# Patient Record
Sex: Male | Born: 1970 | Race: White | Hispanic: No | Marital: Married | Smoking: Never smoker
Health system: Southern US, Community
[De-identification: ages and names within clinical notes are randomized; demographics above are authoritative.]

## PROBLEM LIST (undated history)

## (undated) DIAGNOSIS — I499 Cardiac arrhythmia, unspecified: Secondary | ICD-10-CM

## (undated) DIAGNOSIS — B029 Zoster without complications: Secondary | ICD-10-CM

## (undated) DIAGNOSIS — T8859XA Other complications of anesthesia, initial encounter: Secondary | ICD-10-CM

## (undated) DIAGNOSIS — I480 Paroxysmal atrial fibrillation: Secondary | ICD-10-CM

## (undated) HISTORY — PX: OTHER SURGICAL HISTORY: SHX169

## (undated) HISTORY — DX: Paroxysmal atrial fibrillation: I48.0

## (undated) HISTORY — DX: Zoster without complications: B02.9

## (undated) HISTORY — PX: FOOT SURGERY: SHX648

## (undated) HISTORY — PX: WISDOM TOOTH EXTRACTION: SHX21

---

## 2001-02-19 ENCOUNTER — Other Ambulatory Visit: Admission: RE | Admit: 2001-02-19 | Discharge: 2001-02-19 | Payer: Self-pay | Admitting: Urology

## 2001-02-19 ENCOUNTER — Encounter (INDEPENDENT_AMBULATORY_CARE_PROVIDER_SITE_OTHER): Payer: Self-pay | Admitting: Specialist

## 2006-01-28 ENCOUNTER — Emergency Department (HOSPITAL_COMMUNITY): Admission: EM | Admit: 2006-01-28 | Discharge: 2006-01-28 | Payer: Self-pay | Admitting: Emergency Medicine

## 2010-07-04 ENCOUNTER — Ambulatory Visit: Payer: Self-pay | Admitting: Family Medicine

## 2010-07-05 LAB — CONVERTED CEMR LAB
ALT: 28 units/L (ref 0–53)
BUN: 13 mg/dL (ref 6–23)
Bilirubin, Direct: 0.1 mg/dL (ref 0.0–0.3)
Calcium: 9.6 mg/dL (ref 8.4–10.5)
Chloride: 106 meq/L (ref 96–112)
Cholesterol: 164 mg/dL (ref 0–200)
Creatinine, Ser: 1.1 mg/dL (ref 0.4–1.5)
HDL: 36.6 mg/dL — ABNORMAL LOW (ref >39.00)
LDL Cholesterol: 111 mg/dL — ABNORMAL HIGH (ref 0–99)
Total Bilirubin: 1.4 mg/dL — ABNORMAL HIGH (ref 0.3–1.2)
Total CHOL/HDL Ratio: 4
Total Protein: 7 g/dL (ref 6.0–8.3)
Triglycerides: 82 mg/dL (ref 0.0–149.0)
VLDL: 16.4 mg/dL (ref 0.0–40.0)

## 2010-08-09 NOTE — Assessment & Plan Note (Signed)
Summary: NEW PT TO EST/CPX/CLE   Vital Signs:  Patient profile:   40 year old male Height:      73 inches Weight:      210.75 pounds BMI:     27.91 Temp:     98.4 degrees F oral Pulse rate:   72 / minute Pulse rhythm:   regular BP sitting:   122 / 78  (left arm) Cuff size:   large  Vitals Entered By: Selena Batten Dance CMA Duncan Dull) (July 04, 2010 9:31 AM) CC: New patient to establish/CPx   History of Present Illness: CC: new patient, CPE  no concerns.  stays active by exercising 3x/wk, weight training.  knees hurt at times so doesn't run or job.  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  tries to eat healthy diet, stays away from greasy foods, fast food, low intake of frutis/vegetables.  preventative: flu - not this year. tetanus - 2007 prostate - strong stream, nocturia colon - no BM changes, no blood in stool. blood work - not in a while.    -  Date:  12/20/2005    TD booster given per patient  Current Medications (verified): 1)  None  Allergies (verified): No Known Drug Allergies  Past History:  Past Medical History: none  Past Surgical History: wisdom teeth  Family History: M: Lung and throat CA (smoker) dec 40yo F: HTN GF: DM  No prostate or colon CA, CAD/MI, CVA  Social History: no smoking, no EtOH, no rec drugs caffeine: cut out sodas, occasional tea Occupation: Physicist, medical for [employer] Lives with wife, 3 children 12th grade  Review of Systems  The patient denies anorexia, fever, weight loss, weight gain, vision loss, decreased hearing, hoarseness, chest pain, syncope, dyspnea on exertion, peripheral edema, prolonged cough, headaches, hemoptysis, abdominal pain, melena, hematochezia, severe indigestion/heartburn, hematuria, incontinence, difficulty walking, depression, and testicular masses.         + hoarse today.  Physical Exam  General:  Well-developed,well-nourished,in no acute distress; alert,appropriate and cooperative  throughout examination Head:  Normocephalic and atraumatic without obvious abnormalities. No apparent alopecia or balding. Eyes:  No corneal or conjunctival inflammation noted. EOMI. Perrla.  Ears:  TMs clear bilaterally Nose:  nares clear bilaterally Mouth:  MMM, no pharyngeal erythema/edema Neck:  No deformities, masses, or tenderness noted.  No LAD, no thyromegaly Lungs:  Normal respiratory effort, chest expands symmetrically. Lungs are clear to auscultation, no crackles or wheezes. Heart:  Normal rate and regular rhythm. S1 and S2 normal without gallop, murmur, click, rub or other extra sounds. Abdomen:  Bowel sounds positive,abdomen soft and non-tender without masses, organomegaly or hernias noted. Msk:  No deformity or scoliosis noted of thoracic or lumbar spine.   Pulses:  2+ rad pulses, brisk cap refill Extremities:  no pedal edema Neurologic:  CN grossly intact ,station and gait intact Skin:  Intact without suspicious lesions or rashes Psych:  full affect, pleasant and cooperative.   Impression & Recommendations:  Problem # 1:  HEALTH MAINTENANCE EXAM (ICD-V70.0)  Reviewed preventive care protocols, scheduled due services, and updated immunizations.  declines flu.  utd tetanus.  discussed healthy eating, healthy lifestyle.  Orders: TLB-Lipid Panel (80061-LIPID) TLB-BMP (Basic Metabolic Panel-BMET) (80048-METABOL) TLB-Hepatic/Liver Function Pnl (80076-HEPATIC)  Patient Instructions: 1)  Blood work today to check cholesterol, sugar. 2)  Return in 1-2 years for next CPE or as needed. 3)  Pleasure to meet you today, call clinic with questions.   Orders Added: 1)  TLB-Lipid Panel [80061-LIPID] 2)  TLB-BMP (  Basic Metabolic Panel-BMET) [80048-METABOL] 3)  TLB-Hepatic/Liver Function Pnl [80076-HEPATIC] 4)  New Patient 40-64 years [99386]    Prior Medications: None Current Allergies (reviewed today): No known allergies

## 2015-11-12 ENCOUNTER — Encounter (HOSPITAL_COMMUNITY): Payer: Self-pay | Admitting: Emergency Medicine

## 2015-11-12 ENCOUNTER — Emergency Department (HOSPITAL_COMMUNITY)
Admission: EM | Admit: 2015-11-12 | Discharge: 2015-11-12 | Disposition: A | Discharge status: Home / Self Care | Payer: BLUE CROSS/BLUE SHIELD | Attending: Emergency Medicine | Admitting: Emergency Medicine

## 2015-11-12 DIAGNOSIS — K5641 Fecal impaction: Secondary | ICD-10-CM | POA: Insufficient documentation

## 2015-11-12 DIAGNOSIS — Z79899 Other long term (current) drug therapy: Secondary | ICD-10-CM | POA: Insufficient documentation

## 2015-11-12 DIAGNOSIS — R109 Unspecified abdominal pain: Secondary | ICD-10-CM | POA: Diagnosis present

## 2015-11-12 DIAGNOSIS — K5900 Constipation, unspecified: Secondary | ICD-10-CM

## 2015-11-12 LAB — CBC
HCT: 44.4 % (ref 39.0–52.0)
HEMOGLOBIN: 14.8 g/dL (ref 13.0–17.0)
MCH: 28.7 pg (ref 26.0–34.0)
MCHC: 33.3 g/dL (ref 30.0–36.0)
MCV: 86 fL (ref 78.0–100.0)
Platelets: 194 10*3/uL (ref 150–400)
RBC: 5.16 MIL/uL (ref 4.22–5.81)
RDW: 12.2 % (ref 11.5–15.5)
WBC: 13.8 10*3/uL — ABNORMAL HIGH (ref 4.0–10.5)

## 2015-11-12 LAB — COMPREHENSIVE METABOLIC PANEL
ALK PHOS: 61 U/L (ref 38–126)
ALT: 18 U/L (ref 17–63)
ANION GAP: 16 — AB (ref 5–15)
AST: 20 U/L (ref 15–41)
Albumin: 4.1 g/dL (ref 3.5–5.0)
BUN: 19 mg/dL (ref 6–20)
CALCIUM: 9.4 mg/dL (ref 8.9–10.3)
CO2: 20 mmol/L — AB (ref 22–32)
Chloride: 105 mmol/L (ref 101–111)
Creatinine, Ser: 1.19 mg/dL (ref 0.61–1.24)
GFR calc non Af Amer: >60 mL/min (ref >60)
Glucose, Bld: 116 mg/dL — ABNORMAL HIGH (ref 65–99)
Potassium: 4.1 mmol/L (ref 3.5–5.1)
SODIUM: 141 mmol/L (ref 135–145)
Total Bilirubin: 0.9 mg/dL (ref 0.3–1.2)
Total Protein: 6.7 g/dL (ref 6.5–8.1)

## 2015-11-12 LAB — URINALYSIS, ROUTINE W REFLEX MICROSCOPIC
Bilirubin Urine: NEGATIVE
Glucose, UA: NEGATIVE mg/dL
HGB URINE DIPSTICK: NEGATIVE
Ketones, ur: 15 mg/dL — AB
Leukocytes, UA: NEGATIVE
NITRITE: NEGATIVE
Protein, ur: NEGATIVE mg/dL
SPECIFIC GRAVITY, URINE: 1.025 (ref 1.005–1.030)
pH: 6 (ref 5.0–8.0)

## 2015-11-12 LAB — LIPASE, BLOOD: Lipase: 34 U/L (ref 11–51)

## 2015-11-12 MED ORDER — MILK AND MOLASSES ENEMA
1.0000 | Freq: Once | RECTAL | Status: AC
  Administered 2015-11-12: 250 mL via RECTAL
  Filled 2015-11-12 (×2): qty 250

## 2015-11-12 MED ORDER — POLYETHYLENE GLYCOL 3350 17 GM/SCOOP PO POWD: 17.0000 g | Freq: Two times a day (BID) | ORAL | Status: DC

## 2015-11-12 MED ORDER — SODIUM CHLORIDE 0.9 % IV BOLUS (SEPSIS)
1000.0000 mL | Freq: Once | INTRAVENOUS | Status: AC
  Administered 2015-11-12: 1000 mL via INTRAVENOUS

## 2015-11-12 NOTE — ED Notes (Signed)
Patient had large-sized BM following milk and molasses enema. Patient reports that he feels 100% better and is ready to go.

## 2015-11-12 NOTE — Discharge Instructions (Signed)
Constipation, Adult °Constipation is when a person has fewer than three bowel movements a week, has difficulty having a bowel movement, or has stools that are dry, hard, or larger than normal. As people grow older, constipation is more common. A low-fiber diet, not taking in enough fluids, and taking certain medicines may make constipation worse.  °CAUSES  °· Certain medicines, such as antidepressants, pain medicine, iron supplements, antacids, and water pills.   °· Certain diseases, such as diabetes, irritable bowel syndrome (IBS), thyroid disease, or depression.   °· Not drinking enough water.   °· Not eating enough fiber-rich foods.   °· Stress or travel.   °· Lack of physical activity or exercise.   °· Ignoring the urge to have a bowel movement.   °· Using laxatives too much.   °SIGNS AND SYMPTOMS  °· Having fewer than three bowel movements a week.   °· Straining to have a bowel movement.   °· Having stools that are hard, dry, or larger than normal.   °· Feeling full or bloated.   °· Pain in the lower abdomen.   °· Not feeling relief after having a bowel movement.   °DIAGNOSIS  °Your health care provider will take a medical history and perform a physical exam. Further testing may be done for severe constipation. Some tests may include: °· A barium enema X-ray to examine your rectum, colon, and, sometimes, your small intestine.   °· A sigmoidoscopy to examine your lower colon.   °· A colonoscopy to examine your entire colon. °TREATMENT  °Treatment will depend on the severity of your constipation and what is causing it. Some dietary treatments include drinking more fluids and eating more fiber-rich foods. Lifestyle treatments may include regular exercise. If these diet and lifestyle recommendations do not help, your health care provider may recommend taking over-the-counter laxative medicines to help you have bowel movements. Prescription medicines may be prescribed if over-the-counter medicines do not work.    °HOME CARE INSTRUCTIONS  °· Eat foods that have a lot of fiber, such as fruits, vegetables, whole grains, and beans. °· Limit foods high in fat and processed sugars, such as french fries, hamburgers, cookies, candies, and soda.   °· A fiber supplement may be added to your diet if you cannot get enough fiber from foods.   °· Drink enough fluids to keep your urine clear or pale yellow.   °· Exercise regularly or as directed by your health care provider.   °· Go to the restroom when you have the urge to go. Do not hold it.   °· Only take over-the-counter or prescription medicines as directed by your health care provider. Do not take other medicines for constipation without talking to your health care provider first.   °SEEK IMMEDIATE MEDICAL CARE IF:  °· You have bright red blood in your stool.   °· Your constipation lasts for more than 4 days or gets worse.   °· You have abdominal or rectal pain.   °· You have thin, pencil-like stools.   °· You have unexplained weight loss. °MAKE SURE YOU:  °· Understand these instructions. °· Will watch your condition. °· Will get help right away if you are not doing well or get worse. °  °This information is not intended to replace advice given to you by your health care provider. Make sure you discuss any questions you have with your health care provider. °  °Document Released: 03/22/2004 Document Revised: 07/15/2014 Document Reviewed: 04/05/2013 °Elsevier Interactive Patient Education ©2016 [employer] ° °High-Fiber Diet °Fiber, also called dietary fiber, is a type of carbohydrate found in fruits, vegetables, whole grains, and   beans. A high-fiber diet can have many health benefits. Your health care provider may recommend a high-fiber diet to help: °· Prevent constipation. Fiber can make your bowel movements more regular. °· Lower your cholesterol. °· Relieve hemorrhoids, uncomplicated diverticulosis, or irritable bowel syndrome. °· Prevent overeating as part of a weight-loss  plan. °· Prevent heart disease, type 2 diabetes, and certain cancers. °WHAT IS MY PLAN? °The recommended daily intake of fiber includes: °· 38 grams for men under age 50. °· 30 grams for men over age 50. °· 25 grams for women under age 50. °· 21 grams for women over age 50. °You can get the recommended daily intake of dietary fiber by eating a variety of fruits, vegetables, grains, and beans. Your health care provider may also recommend a fiber supplement if it is not possible to get enough fiber through your diet. °WHAT DO I NEED TO KNOW ABOUT A HIGH-FIBER DIET? °· Fiber supplements have not been widely studied for their effectiveness, so it is better to get fiber through food sources. °· Always check the fiber content on the nutrition facts label of any prepackaged food. Look for foods that contain at least 5 grams of fiber per serving. °· Ask your dietitian if you have questions about specific foods that are related to your condition, especially if those foods are not listed in the following section. °· Increase your daily fiber consumption gradually. Increasing your intake of dietary fiber too quickly may cause bloating, cramping, or gas. °· Drink plenty of water. Water helps you to digest fiber. °WHAT FOODS CAN I EAT? °Grains °Whole-grain breads. Multigrain cereal. Oats and oatmeal. Brown rice. Barley. Bulgur wheat. Millet. Bran muffins. Popcorn. Rye wafer crackers. °Vegetables °Sweet potatoes. Spinach. Kale. Artichokes. Cabbage. Broccoli. Green peas. Carrots. Squash. °Fruits °Berries. Pears. Apples. Oranges. Avocados. Prunes and raisins. Dried figs. °Meats and Other Protein Sources °Navy, kidney, pinto, and soy beans. Split peas. Lentils. Nuts and seeds. °Dairy °Fiber-fortified yogurt. °Beverages °Fiber-fortified soy milk. Fiber-fortified orange juice. °Other °Fiber bars. °The items listed above may not be a complete list of recommended foods or beverages. Contact your dietitian for more options. °WHAT FOODS  ARE NOT RECOMMENDED? °Grains °White bread. Pasta made with refined flour. White rice. °Vegetables °Fried potatoes. Canned vegetables. Well-cooked vegetables.  °Fruits °Fruit juice. Cooked, strained fruit. °Meats and Other Protein Sources °Fatty cuts of meat. Fried poultry or fried fish. °Dairy °Milk. Yogurt. Cream cheese. Sour cream. °Beverages °Soft drinks. °Other °Cakes and pastries. Butter and oils. °The items listed above may not be a complete list of foods and beverages to avoid. Contact your dietitian for more information. °WHAT ARE SOME TIPS FOR INCLUDING HIGH-FIBER FOODS IN MY DIET? °· Eat a wide variety of high-fiber foods. °· Make sure that half of all grains consumed each day are whole grains. °· Replace breads and cereals made from refined flour or white flour with whole-grain breads and cereals. °· Replace white rice with brown rice, bulgur wheat, or millet. °· Start the day with a breakfast that is high in fiber, such as a cereal that contains at least 5 grams of fiber per serving. °· Use beans in place of meat in soups, salads, or pasta. °· Eat high-fiber snacks, such as berries, raw vegetables, nuts, or popcorn. °  °This information is not intended to replace advice given to you by your health care provider. Make sure you discuss any questions you have with your health care provider. °  °Document Released: 06/24/2005 Document Revised: 07/15/2014 Document   Reviewed: 12/07/2013 °Elsevier Interactive Patient Education ©2016 Elsevier Inc. ° °

## 2015-11-12 NOTE — ED Notes (Signed)
Patient verbalized understanding of discharge instructions and denies any further needs or questions at this time. VS stable. Patient ambulatory with steady gait.  

## 2015-11-12 NOTE — ED Notes (Addendum)
C/o mild constipation over the past 2-3 weeks.  States today he had sudden onset of sharp pain to mid lower abd with nausea.  Pt states he had a very small BM and used a Dulcolax suppository without relief.  Denies urinary complaints.

## 2015-11-12 NOTE — ED Provider Notes (Signed)
CSN: [id number]     Arrival date & time 11/12/15  1601 History   First MD Initiated Contact with Patient 11/12/15 1708     Chief Complaint  Patient presents with  . Abdominal Pain  . Constipation    Justin Carson is a 45 y.o. male who presents to the ED complaining of constipation for the past 3 weeks and onset today of moderate suprapubic and lower abdominal pain and rectal pain. He reports he feels he has to have a bowel movement. He reports he has not been passing gas recently. He complains of nausea but no vomiting. He denies hematochezia. No previous abdominal surgeries. He used Dulcolax suppository and had a small bowel movement today. He still feels pain in his rectum and urge to defecate.  He denies fevers, urinary symptoms, vomiting, hematochezia, previous abdominal surgeries, coughing, chest pain, shortness of breath or rashes.  Patient is a 45 y.o. male presenting with abdominal pain and constipation. The history is provided by the patient. No language interpreter was used.  Abdominal Pain Associated symptoms: constipation and nausea   Associated symptoms: no chest pain, no chills, no cough, no diarrhea, no dysuria, no fever, no hematuria, no shortness of breath, no sore throat and no vomiting   Constipation Associated symptoms: abdominal pain and nausea   Associated symptoms: no back pain, no diarrhea, no dysuria, no fever and no vomiting     History reviewed. No pertinent past medical history. History reviewed. No pertinent past surgical history. No family history on file. Social History  Substance Use Topics  . Smoking status: Never Smoker   . Smokeless tobacco: None  . Alcohol Use: No    Review of Systems  Constitutional: Negative for fever and chills.  HENT: Negative for congestion and sore throat.   Eyes: Negative for visual disturbance.  Respiratory: Negative for cough and shortness of breath.   Cardiovascular: Negative for chest pain and palpitations.   Gastrointestinal: Positive for nausea, abdominal pain, constipation and rectal pain. Negative for vomiting, diarrhea and anal bleeding.  Genitourinary: Negative for dysuria, urgency, frequency, hematuria and difficulty urinating.  Musculoskeletal: Negative for back pain and neck pain.  Skin: Negative for rash.  Neurological: Negative for headaches.      Allergies  Review of patient's allergies indicates not on file.  Home Medications   Prior to Admission medications   Medication Sig Start Date End Date Taking? Authorizing Provider  acetaminophen (TYLENOL) 500 MG tablet Take 1,000 mg by mouth every 6 (six) hours as needed for moderate pain.   Yes Historical Provider, MD  bisacodyl (DULCOLAX) 10 MG suppository Place 10 mg rectally as needed for moderate constipation.   Yes Historical Provider, MD  Methylcellulose, Laxative, (CITRUCEL PO) Take 2 tablets by mouth every morning.    Yes Historical Provider, MD  polyethylene glycol powder (GLYCOLAX/MIRALAX) powder Take 17 g by mouth 2 (two) times daily. Until daily soft stools  OTC 11/12/15   Waynetta Pean, PA-C   BP 110/68 mmHg  Pulse 66  Temp(Src) 97.5 F (36.4 C) (Oral)  Resp 18  Ht 6\' 1"  (1.854 m)  Wt 86.183 kg  BMI 25.07 kg/m2  SpO2 97% Physical Exam  Constitutional: He appears well-developed and well-nourished. No distress.  HENT:  Head: Normocephalic and atraumatic.  Mouth/Throat: Oropharynx is clear and moist.  Eyes: Conjunctivae are normal. Pupils are equal, round, and reactive to light. Right eye exhibits no discharge. Left eye exhibits no discharge.  Neck: Neck supple.  Cardiovascular: Normal rate, regular  rhythm, normal heart sounds and intact distal pulses.  Exam reveals no gallop and no friction rub.   No murmur heard. Pulmonary/Chest: Effort normal and breath sounds normal. No respiratory distress. He has no wheezes. He has no rales.  Abdominal: Soft. Bowel sounds are normal. He exhibits no distension and no mass.  There is tenderness. There is no rebound and no guarding.  Abdomen is soft and bowel sounds are present. Patient has generalized abdominal tenderness to palpation that is worse in his bilateral lower abdomen and suprapubic area. No peritoneal signs.  Genitourinary:  Anal sphincter tone is normal. Large fecal impaction noted. No gross bloody stool.   Musculoskeletal: He exhibits no edema or tenderness.  Lymphadenopathy:    He has no cervical adenopathy.  Neurological: He is alert. Coordination normal.  Skin: Skin is warm and dry. No rash noted. He is not diaphoretic. No erythema. No pallor.  Psychiatric: He has a normal mood and affect. His behavior is normal.  Nursing note and vitals reviewed.   ED Course  Procedures (including critical care time) Labs Review Labs Reviewed  COMPREHENSIVE METABOLIC PANEL - Abnormal; Notable for the following:    CO2 20 (*)    Glucose, Bld 116 (*)    Anion gap 16 (*)    All other components within normal limits  CBC - Abnormal; Notable for the following:    WBC 13.8 (*)    All other components within normal limits  URINALYSIS, ROUTINE W REFLEX MICROSCOPIC (NOT AT [facility]) - Abnormal; Notable for the following:    Ketones, ur 15 (*)    All other components within normal limits  LIPASE, BLOOD    Imaging Review No results found. I have personally reviewed and evaluated these lab results as part of my medical decision-making.   EKG Interpretation None     Filed Vitals:   11/12/15 1609 11/12/15 1941 11/12/15 1945 11/12/15 2000  BP: 106/67 117/69 130/71 110/68  Pulse: 63 59 69 66  Temp: 97.5 F (36.4 C)     TempSrc: Oral     Resp: 18     Height: 6\' 1"  (1.854 m)     Weight: 86.183 kg     SpO2: 100% 96% 97% 97%    MDM   Meds given in ED:  Medications  milk and molasses enema (not administered)  sodium chloride 0.9 % bolus 1,000 mL (1,000 mLs Intravenous New Bag/Given 11/12/15 1942)    New Prescriptions   POLYETHYLENE GLYCOL POWDER  (GLYCOLAX/MIRALAX) POWDER    Take 17 g by mouth 2 (two) times daily. Until daily soft stools  OTC     Final diagnoses:  Fecal impaction (HCC)  Constipation, unspecified constipation type    This is a 45 y.o. male who presents to the ED complaining of constipation for the past 3 weeks and onset today of moderate suprapubic and lower abdominal pain and rectal pain. He reports he feels he has to have a bowel movement. He reports he has not been passing gas recently. He complains of nausea but no vomiting. He denies hematochezia. No previous abdominal surgeries. He used Dulcolax suppository and had a small bowel movement today. He still feels pain in his rectum and urge to defecate. On exam the patient is afebrile and nontoxic appearing. He has generalized abdominal tenderness which is worse in his lower abdomen. No focal tenderness. No peritoneal signs. On rectal exam he has a large fecal impaction. Is unable to disimpact him manually. Will provide him with  an enema. Lipase is within normal limits. CMP is unremarkable. Urinalysis is unremarkable. CBC is remarkable only for white count of 13,000.  Patient was provided with enema and had a very large bowel movement. At reevaluation patient reports he is feeling totally back to normal. He reports his pain his rectum has resolved. He reports his abdominal pain has resolved. He reports feeling ready for discharge. We will discharge patient with MiraLAX. I also educated on high fiber diet. I encouraged him to follow-up with primary care. I advised the patient to follow-up with their primary care provider this week. I advised the patient to return to the emergency department with new or worsening symptoms or new concerns. The patient verbalized understanding and agreement with plan.      Waynetta Pean, PA-C 11/12/15 2258  Pattricia Boss, MD 11/13/15 Dyann Kief

## 2016-05-06 ENCOUNTER — Encounter: Payer: Self-pay | Admitting: Podiatry

## 2016-05-06 ENCOUNTER — Ambulatory Visit (INDEPENDENT_AMBULATORY_CARE_PROVIDER_SITE_OTHER): Payer: BLUE CROSS/BLUE SHIELD | Admitting: Podiatry

## 2016-05-06 VITALS — BP 136/75 | HR 64 | Resp 16 | Ht 73.0 in | Wt 180.0 lb

## 2016-05-06 DIAGNOSIS — L600 Ingrowing nail: Secondary | ICD-10-CM | POA: Diagnosis not present

## 2016-05-06 NOTE — Progress Notes (Signed)
   Subjective:    Patient ID: Justin Carson, male    DOB: Nov 05, 1970, 45 y.o.   MRN: [id number]  HPI 45 year old male percent [place] for concerns of ingrown toenail to the left medial fourth toe which is been ongoing the last several weeks. He is tried from the toenail the any significant improvement. There is painful pressure in shoe gear. Denies a drainage or pus any redness. No other complaints at this time.   Review of Systems  All other systems reviewed and are negative.      Objective:   Physical Exam General: AAO x3, NAD  Dermatological: There is incurvation on the medial aspect of the left fourth digit toenail. There is tenderness palpation of this area. Is no tenderness the lateral nail border to the other toenails. Hyperkeratotic tissue is present on the nail borders well. There is no edema, erythema, drainage or pus or any malodor. No clinical signs of infection.  Vascular: Dorsalis Pedis artery and Posterior Tibial artery pedal pulses are 2/4 bilateral with immedate capillary fill time.There is no pain with calf compression, swelling, warmth, erythema.   Neruologic: Grossly intact via light touch bilateral. Vibratory intact via tuning fork bilateral. Protective threshold with Semmes Wienstein monofilament intact to all pedal sites bilateral.   Musculoskeletal: HAV and hammertoes are present. No pain, crepitus, or limitation noted with foot and ankle range of motion bilateral. Muscular strength 5/5 in all groups tested bilateral.  Gait: Unassisted, Nonantalgic.     Assessment & Plan:  45 year old male left medial fourth digit symptomatic ingrown toenail -Treatment options discussed including all alternatives, risks, and complications -Etiology of symptoms were discussed -At this time, the patient is requesting partial nail removal with chemical matricectomy to the symptomatic portion of the nail. Risks and complications were discussed with the patient for which they understand  and  verbally consent to the procedure. Under sterile conditions a total of 3 mL of a mixture of 2% lidocaine plain and 0.5% Marcaine plain was infiltrated in a digital block fashion. Once anesthetized, the skin was prepped in sterile fashion. A tourniquet was then applied. Next the medial aspect of left 4th digit nail border was then sharply excised making sure to remove the entire offending nail border. Once the nails were ensured to be removed area was debrided and the underlying skin was intact. There is no purulence identified in the procedure. Next phenol was then applied under standard conditions and copiously irrigated. Silvadene was applied. A dry sterile dressing was applied. After application of the dressing the tourniquet was removed and there is found to be an immediate capillary refill time to the digit. The patient tolerated the procedure well any complications. Post procedure instructions were discussed the patient for which he verbally understood. Follow-up in one week for nail check or sooner if any problems are to arise. Discussed signs/symptoms of infection and directed to call the office immediately should any occur or go directly to the emergency room. In the meantime, encouraged to call the office with any questions, concerns, changes symptoms.  Celesta Gentile, DPM

## 2016-05-06 NOTE — Patient Instructions (Signed)

## 2016-05-17 ENCOUNTER — Ambulatory Visit: Payer: BLUE CROSS/BLUE SHIELD | Admitting: Podiatry

## 2017-05-26 ENCOUNTER — Encounter: Payer: Self-pay | Admitting: Primary Care

## 2017-05-26 ENCOUNTER — Ambulatory Visit (INDEPENDENT_AMBULATORY_CARE_PROVIDER_SITE_OTHER): Payer: BLUE CROSS/BLUE SHIELD | Admitting: Primary Care

## 2017-05-26 VITALS — BP 110/66 | HR 59 | Temp 98.1°F | Ht 72.5 in | Wt 204.4 lb

## 2017-05-26 DIAGNOSIS — Z0000 Encounter for general adult medical examination without abnormal findings: Secondary | ICD-10-CM

## 2017-05-26 DIAGNOSIS — Z23 Encounter for immunization: Secondary | ICD-10-CM

## 2017-05-26 LAB — LIPID PANEL
CHOL/HDL RATIO: 3
CHOLESTEROL: 145 mg/dL (ref 0–200)
HDL: 50 mg/dL (ref >39.00)
LDL CALC: 84 mg/dL (ref 0–99)
NonHDL: 94.61
TRIGLYCERIDES: 52 mg/dL (ref 0.0–149.0)
VLDL: 10.4 mg/dL (ref 0.0–40.0)

## 2017-05-26 LAB — COMPREHENSIVE METABOLIC PANEL
ALBUMIN: 4.2 g/dL (ref 3.5–5.2)
ALT: 11 U/L (ref 0–53)
AST: 16 U/L (ref 0–37)
Alkaline Phosphatase: 51 U/L (ref 39–117)
BUN: 12 mg/dL (ref 6–23)
CALCIUM: 9.4 mg/dL (ref 8.4–10.5)
CO2: 29 meq/L (ref 19–32)
Chloride: 107 mEq/L (ref 96–112)
Creatinine, Ser: 1.17 mg/dL (ref 0.40–1.50)
GFR: 70.75 mL/min (ref >60.00)
Glucose, Bld: 100 mg/dL — ABNORMAL HIGH (ref 70–99)
Potassium: 4.7 mEq/L (ref 3.5–5.1)
Sodium: 141 mEq/L (ref 135–145)
Total Bilirubin: 0.6 mg/dL (ref 0.2–1.2)
Total Protein: 6.6 g/dL (ref 6.0–8.3)

## 2017-05-26 NOTE — Assessment & Plan Note (Signed)
Td due, provided today. Declines influenza vaccination. Recommended healthy diet and regular exercise. Limit sweets. Exam unremarkable. Labs pending. Follow up in 1 year for annual exam.

## 2017-05-26 NOTE — Patient Instructions (Addendum)
Complete lab work prior to leaving today. I will notify you of your results once received.   Start exercising. You should be getting 150 minutes of moderate intensity exercise weekly.  Ensure you are consuming 64 ounces of water daily.  You were provided with a tetanus vaccination which will cover you for 10 years.   Follow up in 1 year for your annual exam or sooner if needed.  It was a pleasure to meet you today! Please don't hesitate to call me with any questions. Welcome to [facility]!

## 2017-05-26 NOTE — Addendum Note (Signed)
Addended by: Jacqualin Combes on: 05/26/2017 04:57 PM   Modules accepted: Orders

## 2017-05-26 NOTE — Progress Notes (Signed)
Subjective:    Patient ID: Justin Carson, male    DOB: 1970/11/13, 46 y.o.   MRN: [id number]  HPI  Justin Carson is a 46 year old male who presents today to establish care and for complete physical.  Immunizations: -Tetanus: Completed in 2007, due. -Influenza: Declines   Diet: He endorses a fair diet. Breakfast: eggs, sausage Lunch: skips Dinner: meat, salads Snacks: fruit, smoothie with protein  Desserts: 4-5 times weekly Beverages: water, unsweet tea  Exercise: He does not currently exercise. Eye exam: Completed in 2018 Dental exam: Completes semi-annually    Review of Systems  Constitutional: Negative for unexpected weight change.  HENT: Negative for rhinorrhea.   Respiratory: Negative for cough and shortness of breath.   Cardiovascular: Negative for chest pain.  Gastrointestinal: Negative for diarrhea.       Occasional constipation, bowel movements occur every 2-3 days.  Genitourinary: Negative for difficulty urinating.  Musculoskeletal: Negative for arthralgias and myalgias.  Skin: Negative for rash.  Allergic/Immunologic: Negative for environmental allergies.  Neurological: Negative for dizziness, numbness and headaches.  Psychiatric/Behavioral:       Denies concerns for anxiety and depression       No past medical history on file.   Social History   Socioeconomic History  . Marital status: Married    Spouse name: Not on file  . Number of children: Not on file  . Years of education: Not on file  . Highest education level: Not on file  Social Needs  . Financial resource strain: Not on file  . Food insecurity - worry: Not on file  . Food insecurity - inability: Not on file  . Transportation needs - medical: Not on file  . Transportation needs - non-medical: Not on file  Occupational History  . Not on file  Tobacco Use  . Smoking status: Never Smoker  . Smokeless tobacco: Never Used  Substance and Sexual Activity  . Alcohol use: No  . Drug use:  No  . Sexual activity: Not on file  Other Topics Concern  . Not on file  Social History Narrative   Married.   3 children.   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    Enjoys spending time with family.     No past surgical history on file.  Family History  Problem Relation Age of Onset  . Lung cancer Mother   . COPD Mother   . Hypertension Father   . Liver disease Father     No Known Allergies  Current Outpatient Medications on File Prior to Visit  Medication Sig Dispense Refill  . acetaminophen (TYLENOL) 500 MG tablet Take 1,000 mg by mouth every 6 (six) hours as needed for moderate pain.    . Methylcellulose, Laxative, (CITRUCEL PO) Take 2 tablets by mouth every morning.      No current facility-administered medications on file prior to visit.     BP 110/66   Pulse (!) 59   Temp 98.1 F (36.7 C) (Oral)   Ht 6' 0.5" (1.842 m)   Wt 204 lb 6.4 oz (92.7 kg)   SpO2 96%   BMI 27.34 kg/m    Objective:   Physical Exam  Constitutional: He is oriented to person, place, and time. He appears well-nourished.  HENT:  Right Ear: Tympanic membrane and ear canal normal.  Left Ear: Tympanic membrane and ear canal normal.  Nose: Nose normal. Right sinus exhibits no maxillary sinus tenderness and no frontal sinus tenderness. Left  sinus exhibits no maxillary sinus tenderness and no frontal sinus tenderness.  Mouth/Throat: Oropharynx is clear and moist.  Eyes: Conjunctivae and EOM are normal. Pupils are equal, round, and reactive to light.  Neck: Neck supple. Carotid bruit is not present. No thyromegaly present.  Cardiovascular: Normal rate, regular rhythm and normal heart sounds.  Pulmonary/Chest: Effort normal and breath sounds normal. He has no wheezes. He has no rales.  Abdominal: Soft. Bowel sounds are normal. There is no tenderness.  Musculoskeletal: Normal range of motion.  Neurological: He is alert and oriented to person, place, and time. He has normal  reflexes. No cranial nerve deficit.  Skin: Skin is warm and dry.  Psychiatric: He has a normal mood and affect.          Assessment & Plan:

## 2018-01-05 ENCOUNTER — Encounter: Payer: Self-pay | Admitting: Primary Care

## 2018-05-22 ENCOUNTER — Other Ambulatory Visit: Payer: BLUE CROSS/BLUE SHIELD

## 2018-05-25 ENCOUNTER — Other Ambulatory Visit: Payer: Self-pay | Admitting: Primary Care

## 2018-05-25 DIAGNOSIS — Z0000 Encounter for general adult medical examination without abnormal findings: Secondary | ICD-10-CM

## 2018-05-27 ENCOUNTER — Encounter: Payer: BLUE CROSS/BLUE SHIELD | Admitting: Primary Care

## 2018-06-01 ENCOUNTER — Other Ambulatory Visit (INDEPENDENT_AMBULATORY_CARE_PROVIDER_SITE_OTHER): Payer: BLUE CROSS/BLUE SHIELD

## 2018-06-01 DIAGNOSIS — Z0000 Encounter for general adult medical examination without abnormal findings: Secondary | ICD-10-CM

## 2018-06-01 LAB — COMPREHENSIVE METABOLIC PANEL
ALBUMIN: 4.5 g/dL (ref 3.5–5.2)
ALK PHOS: 50 U/L (ref 39–117)
ALT: 44 U/L (ref 0–53)
AST: 103 U/L — ABNORMAL HIGH (ref 0–37)
BILIRUBIN TOTAL: 0.7 mg/dL (ref 0.2–1.2)
BUN: 14 mg/dL (ref 6–23)
CO2: 28 meq/L (ref 19–32)
CREATININE: 1.34 mg/dL (ref 0.40–1.50)
Calcium: 9.9 mg/dL (ref 8.4–10.5)
Chloride: 105 mEq/L (ref 96–112)
GFR: 60.24 mL/min (ref >60.00)
Glucose, Bld: 97 mg/dL (ref 70–99)
Potassium: 4.8 mEq/L (ref 3.5–5.1)
Sodium: 140 mEq/L (ref 135–145)
Total Protein: 7.2 g/dL (ref 6.0–8.3)

## 2018-06-01 LAB — LIPID PANEL
CHOL/HDL RATIO: 4
Cholesterol: 182 mg/dL (ref 0–200)
HDL: 44.7 mg/dL (ref >39.00)
LDL Cholesterol: 124 mg/dL — ABNORMAL HIGH (ref 0–99)
NonHDL: 137.11
TRIGLYCERIDES: 67 mg/dL (ref 0.0–149.0)
VLDL: 13.4 mg/dL (ref 0.0–40.0)

## 2018-06-01 LAB — HEMOGLOBIN A1C: Hgb A1c MFr Bld: 5.4 % (ref 4.6–6.5)

## 2018-06-01 LAB — TSH: TSH: 1.18 u[IU]/mL (ref 0.35–4.50)

## 2018-06-01 NOTE — Addendum Note (Signed)
Addended by: Carter Kitten on: 06/01/2018 08:31 AM   Modules accepted: Orders

## 2018-06-01 NOTE — Progress Notes (Signed)
t

## 2018-06-03 ENCOUNTER — Ambulatory Visit (INDEPENDENT_AMBULATORY_CARE_PROVIDER_SITE_OTHER): Payer: BLUE CROSS/BLUE SHIELD | Admitting: Primary Care

## 2018-06-03 ENCOUNTER — Encounter: Payer: Self-pay | Admitting: Primary Care

## 2018-06-03 VITALS — BP 108/70 | HR 60 | Temp 98.4°F | Ht 72.5 in | Wt 214.8 lb

## 2018-06-03 DIAGNOSIS — Z0000 Encounter for general adult medical examination without abnormal findings: Secondary | ICD-10-CM | POA: Diagnosis not present

## 2018-06-03 DIAGNOSIS — R740 Nonspecific elevation of levels of transaminase and lactic acid dehydrogenase [LDH]: Secondary | ICD-10-CM | POA: Diagnosis not present

## 2018-06-03 DIAGNOSIS — R7401 Elevation of levels of liver transaminase levels: Secondary | ICD-10-CM

## 2018-06-03 NOTE — Assessment & Plan Note (Signed)
Immunizations UTD. Recommended to work on a healthy diet, increase regular exercise.  Exam unremarkable. Labs reviewed. Follow up in 1 year for CPE.

## 2018-06-03 NOTE — Assessment & Plan Note (Signed)
Normal last year, nearly three times the upper limits of normal. Of note, he was treated for shingles several weeks ago, could be secondary to antiviral medication.  Start with repeating LFT's in 2-3 weeks, if no improvement the proceed with abdominal ultrasound.

## 2018-06-03 NOTE — Progress Notes (Signed)
Subjective:    Patient ID: Justin Carson, male    DOB: [birth year], 47 y.o.   MRN: [id number]  HPI  Justin Carson is a 47 year old male who presents today for complete physical.  Immunizations: -Tetanus: Completed in 2018 -Influenza: Completed this season   Diet: He endorses a fair diet Breakfast: Justin Carson, eggs Lunch: Skips mostly, soup Dinner: Salad, meat, little vegetables, some starch Snacks: Fruit, left overs Desserts: 3-4 days weekly  Beverages: Unsweet tea, water  Exercise: He recently started lifting weights last week, some walking. Eye exam: Completed in 2017 Dental exam: Completes semi-annually    Review of Systems  Constitutional: Negative for unexpected weight change.  HENT: Negative for rhinorrhea.   Respiratory: Negative for cough and shortness of breath.   Cardiovascular: Negative for chest pain.       Has noticed PVC's when dehydrated and when tired, mostly notices at night when sleeping.  Gastrointestinal: Negative for constipation and diarrhea.  Genitourinary: Negative for difficulty urinating.  Musculoskeletal: Negative for arthralgias and myalgias.  Skin: Negative for rash.  Allergic/Immunologic: Negative for environmental allergies.  Neurological: Negative for dizziness, numbness and headaches.       Treated for shingles several weeks ago, healed well without complications  Psychiatric/Behavioral: The patient is not nervous/anxious.        Past Medical History:  Diagnosis Date  . Shingles      Social History   Socioeconomic History  . Marital status: Married    Spouse name: Not on file  . Number of children: Not on file  . Years of education: Not on file  . Highest education level: Not on file  Occupational History  . Not on file  Social Needs  . Financial resource strain: Not on file  . Food insecurity:    Worry: Not on file    Inability: Not on file  . Transportation needs:    Medical: Not on file    Non-medical: Not on file    Tobacco Use  . Smoking status: Never Smoker  . Smokeless tobacco: Never Used  Substance and Sexual Activity  . Alcohol use: No  . Drug use: No  . Sexual activity: Not on file  Lifestyle  . Physical activity:    Days per week: Not on file    Minutes per session: Not on file  . Stress: Not on file  Relationships  . Social connections:    Talks on phone: Not on file    Gets together: Not on file    Attends religious service: Not on file    Active member of club or organization: Not on file    Attends meetings of clubs or organizations: Not on file    Relationship status: Not on file  . Intimate partner violence:    Fear of current or ex partner: Not on file    Emotionally abused: Not on file    Physically abused: Not on file    Forced sexual activity: Not on file  Other Topics Concern  . Not on file  Social History Narrative   Married.   3 children.   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    Enjoys spending time with family.     No past surgical history on file.  Family History  Problem Relation Age of Onset  . Lung cancer Mother   . COPD Mother   . Hypertension Father   . Liver disease Father     No Known  Allergies  Current Outpatient Medications on File Prior to Visit  Medication Sig Dispense Refill  . acetaminophen (TYLENOL) 500 MG tablet Take 1,000 mg by mouth every 6 (six) hours as needed for moderate pain.    . Methylcellulose, Laxative, (CITRUCEL PO) Take 2 tablets by mouth every morning.      No current facility-administered medications on file prior to visit.     BP 108/70   Pulse 60   Temp 98.4 F (36.9 C) (Oral)   Ht 6' 0.5" (1.842 m)   Wt 214 lb 12 oz (97.4 kg)   SpO2 98%   BMI 28.72 kg/m    Objective:   Physical Exam  Constitutional: He is oriented to person, place, and time. He appears well-nourished.  HENT:  Mouth/Throat: No oropharyngeal exudate.  Eyes: Pupils are equal, round, and reactive to light. EOM are  normal.  Neck: Neck supple. No thyromegaly present.  Cardiovascular: Normal rate and regular rhythm.  No PVC's or PAC's.   Respiratory: Effort normal and breath sounds normal.  GI: Soft. Bowel sounds are normal. There is no tenderness.  Musculoskeletal: Normal range of motion.  Neurological: He is alert and oriented to person, place, and time.  Skin: Skin is warm and dry.  Nearly healed spot to left palmer hand. History of shingles several weeks ago to left upper extremity.   Psychiatric: He has a normal mood and affect.           Assessment & Plan:

## 2018-06-03 NOTE — Patient Instructions (Signed)
Continue exercising. You should be getting 150 minutes of moderate intensity exercise weekly.  Make sure to eat a healthy diet, include vegetables, fruit, whole grains, lean protein.  Ensure you are consuming 64 ounces of water daily.  Schedule a lab only appointment for 3 weeks to repeat your liver enzyme test.  We will see you next year or sooner if needed. It was a pleasure to see you today!   Preventive Care 40-64 Years, Male Preventive care refers to lifestyle choices and visits with your health care provider that can promote health and wellness. What does preventive care include?  A yearly physical exam. This is also called an annual well check.  Dental exams once or twice a year.  Routine eye exams. Ask your health care provider how often you should have your eyes checked.  Personal lifestyle choices, including: ? Daily care of your teeth and gums. ? Regular physical activity. ? Eating a healthy diet. ? Avoiding tobacco and drug use. ? Limiting alcohol use. ? Practicing safe sex. ? Taking low-dose aspirin every day starting at age 33. What happens during an annual well check? The services and screenings done by your health care provider during your annual well check will depend on your age, overall health, lifestyle risk factors, and family history of disease. Counseling Your health care provider may ask you questions about your:  Alcohol use.  Tobacco use.  Drug use.  Emotional well-being.  Home and relationship well-being.  Sexual activity.  Eating habits.  Work and work Statistician.  Screening You may have the following tests or measurements:  Height, weight, and BMI.  Blood pressure.  Lipid and cholesterol levels. These may be checked every 5 years, or more frequently if you are over 62 years old.  Skin check.  Lung cancer screening. You may have this screening every year starting at age 48 if you have a 30-pack-year history of smoking and  currently smoke or have quit within the past 15 years.  Fecal occult blood test (FOBT) of the stool. You may have this test every year starting at age 32.  Flexible sigmoidoscopy or colonoscopy. You may have a sigmoidoscopy every 5 years or a colonoscopy every 10 years starting at age 5.  Prostate cancer screening. Recommendations will vary depending on your family history and other risks.  Hepatitis C blood test.  Hepatitis B blood test.  Sexually transmitted disease (STD) testing.  Diabetes screening. This is done by checking your blood sugar (glucose) after you have not eaten for a while (fasting). You may have this done every 1-3 years.  Discuss your test results, treatment options, and if necessary, the need for more tests with your health care provider. Vaccines Your health care provider may recommend certain vaccines, such as:  Influenza vaccine. This is recommended every year.  Tetanus, diphtheria, and acellular pertussis (Tdap, Td) vaccine. You may need a Td booster every 10 years.  Varicella vaccine. You may need this if you have not been vaccinated.  Zoster vaccine. You may need this after age 10.  Measles, mumps, and rubella (MMR) vaccine. You may need at least one dose of MMR if you were born in 1957 or later. You may also need a second dose.  Pneumococcal 13-valent conjugate (PCV13) vaccine. You may need this if you have certain conditions and have not been vaccinated.  Pneumococcal polysaccharide (PPSV23) vaccine. You may need one or two doses if you smoke cigarettes or if you have certain conditions.  Meningococcal vaccine.  You may need this if you have certain conditions.  Hepatitis A vaccine. You may need this if you have certain conditions or if you travel or work in places where you may be exposed to hepatitis A.  Hepatitis B vaccine. You may need this if you have certain conditions or if you travel or work in places where you may be exposed to hepatitis  B.  Haemophilus influenzae type b (Hib) vaccine. You may need this if you have certain risk factors.  Talk to your health care provider about which screenings and vaccines you need and how often you need them. This information is not intended to replace advice given to you by your health care provider. Make sure you discuss any questions you have with your health care provider. Document Released: 07/21/2015 Document Revised: 03/13/2016 Document Reviewed: 04/25/2015 Elsevier Interactive Patient Education  [employer].

## 2018-06-24 ENCOUNTER — Other Ambulatory Visit (INDEPENDENT_AMBULATORY_CARE_PROVIDER_SITE_OTHER): Payer: BLUE CROSS/BLUE SHIELD

## 2018-06-24 DIAGNOSIS — R7401 Elevation of levels of liver transaminase levels: Secondary | ICD-10-CM

## 2018-06-24 DIAGNOSIS — R740 Nonspecific elevation of levels of transaminase and lactic acid dehydrogenase [LDH]: Secondary | ICD-10-CM

## 2018-06-24 LAB — HEPATIC FUNCTION PANEL
ALBUMIN: 4.7 g/dL (ref 3.5–5.2)
ALT: 18 U/L (ref 0–53)
AST: 19 U/L (ref 0–37)
Alkaline Phosphatase: 58 U/L (ref 39–117)
Bilirubin, Direct: 0.1 mg/dL (ref 0.0–0.3)
TOTAL PROTEIN: 7.4 g/dL (ref 6.0–8.3)
Total Bilirubin: 0.7 mg/dL (ref 0.2–1.2)

## 2018-08-24 ENCOUNTER — Telehealth: Payer: Self-pay

## 2018-08-24 DIAGNOSIS — Z20828 Contact with and (suspected) exposure to other viral communicable diseases: Secondary | ICD-10-CM

## 2018-08-24 MED ORDER — OSELTAMIVIR PHOSPHATE 75 MG PO CAPS: 75.0000 mg | ORAL_CAPSULE | Freq: Every day | ORAL | 0 refills | Status: AC

## 2018-08-24 NOTE — Telephone Encounter (Signed)
Mrs Justin Carson said she was dx with Type A flu on 08/23/18. Pt is feeling a little fatigued but no other symptoms at this time. Request prophylactic rx of tamiflu to [facility]. Pt last seen annual exam on 06/03/18.

## 2018-08-24 NOTE — Telephone Encounter (Signed)
Noted, Rx sent to pharmacy. 

## 2018-10-28 ENCOUNTER — Telehealth: Payer: Self-pay | Admitting: Primary Care

## 2018-10-28 NOTE — Telephone Encounter (Signed)
Justin Carson, will you schedule a virtual visit?

## 2018-10-28 NOTE — Telephone Encounter (Signed)
Called to schedule patient for virtual visit per [employer]. Lvm asking him to call office.

## 2018-10-29 ENCOUNTER — Encounter: Payer: Self-pay | Admitting: Primary Care

## 2018-10-29 ENCOUNTER — Other Ambulatory Visit (INDEPENDENT_AMBULATORY_CARE_PROVIDER_SITE_OTHER): Payer: BLUE CROSS/BLUE SHIELD

## 2018-10-29 ENCOUNTER — Ambulatory Visit (INDEPENDENT_AMBULATORY_CARE_PROVIDER_SITE_OTHER): Payer: BLUE CROSS/BLUE SHIELD | Admitting: Primary Care

## 2018-10-29 DIAGNOSIS — R002 Palpitations: Secondary | ICD-10-CM

## 2018-10-29 DIAGNOSIS — I498 Other specified cardiac arrhythmias: Secondary | ICD-10-CM | POA: Insufficient documentation

## 2018-10-29 NOTE — Patient Instructions (Signed)
We will contact you regarding labs.  You will be contacted regarding your referral to cardiology.  Please let us know if you have not been contacted within one week.   It was a pleasure to see you today! Allie Bossier, NP-C

## 2018-10-29 NOTE — Progress Notes (Signed)
Subjective:    Patient ID: Justin Carson, male    DOB: [birth year], 48 y.o.   MRN: [id number]  HPI  Virtual Visit via Video Note  I connected with Justin Carson on 10/29/18 at  9:40 AM EDT by a video enabled telemedicine application and verified that I am speaking with the correct person using two identifiers.   I discussed the limitations of evaluation and management by telemedicine and the availability of in person appointments. The patient expressed understanding and agreed to proceed. He is at home, I am in the office.  History of Present Illness:  Justin Carson is a 48 year old male who presents today with a chief complaint of palpitations.   Symptoms of palpitations have been daily for the last two years that he mostly notices during the night, also does notice during the day when he's calm. The episodes will last hours at a time, he will wake in the morning without symptoms.  Symptoms include noticing an irregular heartbeat, odd sensation to the left side of his chest. He's tried controlling the symptoms himself with proper hydration of water, but has found lately that this isn't working.  He drinks unsweet tea in the morning but doesn't typically drink additional caffeine during the day.   During the episodes he denies feeling weakness, shortness of breath, chest pain.  He will sometimes experience a lightheaded feeling (once monthly) when standing which will last for a few seconds. His last episode of his lightheadedness occurred three days who while in his home standing.  He's been checking his BP at home which is running 120's/80's.   BP Readings from Last 3 Encounters:  06/03/18 108/70  05/26/17 110/66  05/06/16 136/75       Observations/Objective:  Alert and oriented. Appears well. No distress.  Assessment and Plan:  Palpitations nearly daily for the past 2 years, also now with lightheadedness.  Labs from November 2019 unremarkable. No ECG on file. Differentials  include PVC, atrial fibrillation. Doubt thyroid disease. Check labs including TSH, CBC, BMP. Referral placed to cardiology for evaluation and likely Holter Monitor.   Follow Up Instructions:  We will contact you regarding labs.  You will be contacted regarding your referral to cardiology.  Please let us know if you have not been contacted within one week.   It was a pleasure to see you today! Allie Bossier, NP-C    I discussed the assessment and treatment plan with the patient. The patient was provided an opportunity to ask questions and all were answered. The patient agreed with the plan and demonstrated an understanding of the instructions.   The patient was advised to call back or seek an in-person evaluation if the symptoms worsen or if the condition fails to improve as anticipated.     Pleas Koch, NP    Review of Systems  Constitutional: Negative for unexpected weight change.  Eyes: Negative for visual disturbance.  Respiratory: Negative for shortness of breath.   Cardiovascular: Positive for palpitations. Negative for chest pain.  Neurological: Positive for light-headedness. Negative for weakness.       Past Medical History:  Diagnosis Date  . Shingles      Social History   Socioeconomic History  . Marital status: Married    Spouse name: Not on file  . Number of children: Not on file  . Years of education: Not on file  . Highest education level: Not on file  Occupational History  . Not on file  Social Needs  . Financial resource strain: Not on file  . Food insecurity:    Worry: Not on file    Inability: Not on file  . Transportation needs:    Medical: Not on file    Non-medical: Not on file  Tobacco Use  . Smoking status: Never Smoker  . Smokeless tobacco: Never Used  Substance and Sexual Activity  . Alcohol use: No  . Drug use: No  . Sexual activity: Not on file  Lifestyle  . Physical activity:    Days per week: Not on file    Minutes per  session: Not on file  . Stress: Not on file  Relationships  . Social connections:    Talks on phone: Not on file    Gets together: Not on file    Attends religious service: Not on file    Active member of club or organization: Not on file    Attends meetings of clubs or organizations: Not on file    Relationship status: Not on file  . Intimate partner violence:    Fear of current or ex partner: Not on file    Emotionally abused: Not on file    Physically abused: Not on file    Forced sexual activity: Not on file  Other Topics Concern  . Not on file  Social History Narrative   Married.   3 children.   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    Enjoys spending time with family.     No past surgical history on file.  Family History  Problem Relation Age of Onset  . Lung cancer Mother   . COPD Mother   . Hypertension Father   . Liver disease Father     No Known Allergies  Current Outpatient Medications on File Prior to Visit  Medication Sig Dispense Refill  . acetaminophen (TYLENOL) 500 MG tablet Take 1,000 mg by mouth every 6 (six) hours as needed for moderate pain.    . Methylcellulose, Laxative, (CITRUCEL PO) Take 2 tablets by mouth every morning.      No current facility-administered medications on file prior to visit.     There were no vitals taken for this visit.   Objective:   Physical Exam  Constitutional: He is oriented to person, place, and time. He appears well-nourished.  Respiratory: Effort normal.  Musculoskeletal: Normal range of motion.  Neurological: He is alert and oriented to person, place, and time.  Skin: Skin is dry.  Psychiatric: He has a normal mood and affect.           Assessment & Plan:

## 2018-10-29 NOTE — Assessment & Plan Note (Signed)
Palpitations nearly daily for the past 2 years, also now with lightheadedness.  Labs from November 2019 unremarkable. No ECG on file. Differentials include PVC, atrial fibrillation. Doubt thyroid disease. Check labs including TSH, CBC, BMP. Referral placed to cardiology for evaluation and likely Holter Monitor.

## 2018-10-30 ENCOUNTER — Telehealth: Payer: Self-pay | Admitting: *Deleted

## 2018-10-30 LAB — CBC
HCT: 40.1 % (ref 39.0–52.0)
Hemoglobin: 13.5 g/dL (ref 13.0–17.0)
MCHC: 33.7 g/dL (ref 30.0–36.0)
MCV: 83.3 fl (ref 78.0–100.0)
Platelets: 178 10*3/uL (ref 150.0–400.0)
RBC: 4.81 Mil/uL (ref 4.22–5.81)
RDW: 12.7 % (ref 11.5–15.5)
WBC: 6 10*3/uL (ref 4.0–10.5)

## 2018-10-30 LAB — TSH: TSH: 0.97 u[IU]/mL (ref 0.35–4.50)

## 2018-10-30 LAB — COMPREHENSIVE METABOLIC PANEL
ALT: 10 U/L (ref 0–53)
AST: 15 U/L (ref 0–37)
Albumin: 4.4 g/dL (ref 3.5–5.2)
Alkaline Phosphatase: 65 U/L (ref 39–117)
BUN: 16 mg/dL (ref 6–23)
CO2: 27 mEq/L (ref 19–32)
Calcium: 9.3 mg/dL (ref 8.4–10.5)
Chloride: 105 mEq/L (ref 96–112)
Creatinine, Ser: 1.23 mg/dL (ref 0.40–1.50)
GFR: 62.46 mL/min (ref >60.00)
Glucose, Bld: 95 mg/dL (ref 70–99)
Potassium: 4.5 mEq/L (ref 3.5–5.1)
Sodium: 140 mEq/L (ref 135–145)
Total Bilirubin: 0.8 mg/dL (ref 0.2–1.2)
Total Protein: 6.7 g/dL (ref 6.0–8.3)

## 2018-10-30 NOTE — Telephone Encounter (Signed)
SPOKE TO PATIENT CONFIRMED APPT TO BE VIRTUAL ON 11/04/18 WITH DR HARDING. PATIENT VERBALIZE UNDERSTADING

## 2018-10-30 NOTE — Telephone Encounter (Signed)
Follow up  ° ° °Pt is returning call  ° ° °Please call back  °

## 2018-10-30 NOTE — Telephone Encounter (Signed)
Left message - need to discuss appt to a virtual visit with dr harding 4/29

## 2018-11-03 ENCOUNTER — Telehealth: Payer: Self-pay | Admitting: Cardiology

## 2018-11-03 NOTE — Telephone Encounter (Signed)
Mychart, smartphone, pre reg complete 11/03/18 AF °

## 2018-11-04 ENCOUNTER — Encounter: Payer: Self-pay | Admitting: Cardiology

## 2018-11-04 ENCOUNTER — Telehealth (INDEPENDENT_AMBULATORY_CARE_PROVIDER_SITE_OTHER): Payer: BLUE CROSS/BLUE SHIELD | Admitting: Cardiology

## 2018-11-04 VITALS — BP 120/80 | HR 62 | Ht 73.0 in | Wt 195.0 lb

## 2018-11-04 DIAGNOSIS — R002 Palpitations: Secondary | ICD-10-CM

## 2018-11-04 NOTE — Progress Notes (Signed)
Virtual Visit via Video Note   This visit type was conducted due to national recommendations for restrictions regarding the COVID-19 Pandemic (e.g. social distancing) in an effort to limit this patient's exposure and mitigate transmission in our community.  Due to his co-morbid illnesses, this patient is at least at moderate risk for complications without adequate follow up.  This format is felt to be most appropriate for this patient at this time.  All issues noted in this document were discussed and addressed.  A limited physical exam was performed with this format.  Please refer to the patient's chart for his consent to telehealth for [facility].   Patient has given verbal permission to conduct this visit via virtual appointment and to bill insurance 11/05/2018 6:33 PM     Evaluation Performed:  Follow-up visit  Date:  11/05/2018   ID:  Justin Carson, DOB June 07, 1971, MRN [id number]  Patient Location: Home Provider Location: Office  PCP:  Pleas Koch, NP  Cardiologist:  Glenetta Hew, MD new Electrophysiologist:  None   Chief Complaint:  New - Palpitations  History of Present Illness:    Justin Carson is a 48 y.o. male with no real significant PMH who presents via audio/video conferencing for a telehealth visit today with a chief complaint of palpitations.  Justin Carson was last seen recently by PCP: He was complaining of roughly 2-year history of palpitations that occur usually at nighttime.  Notes an irregular heartbeat and satiation on the left side.  Denies any other associated symptoms of weakness, dyspnea or chest pain.  Maybe some lightheadedness, but not associated with the palpitations.  Does not drink excessive caffeine.  Interval History: Today Woods is seen via telemedicine visit doing very well overall, but does note that his irregular heartbeat/palpitation episodes do occur regularly almost every day.  They usually occur in the evening at the end of  the day when he starts to settle down to try to go to sleep. He says the episodes were initially controlled with ensuring adequate hydration, and making sure that he was not taking any additional caffeine or sweets.  Now however they are happening despite every day and have not been as controlled.  Episodes lasts for at least 10-15 min.  No CP. Feels irregular more than fast.   No SOB or fatigue associated with it.  Not present with activity.  No associated dizziness / lightheadedness.  (has had some orthostatic dizziness - not associated with palpitations.    Only has caffeine in AM.  No EtOH. No tobacco.   Cardiovascular ROS: no chest pain or dyspnea on exertion positive for - irregular heartbeat and palpitations negative for - edema, orthopnea, paroxysmal nocturnal dyspnea, shortness of breath or syncope/near syncope.  TIA/amaurosis fugax  The patient does not have symptoms concerning for COVID-19 infection (fever, chills, cough, or new shortness of breath).  The patient is practicing social distancing.  ROS:  Please see the history of present illness.    Review of Systems  Constitutional: Negative for chills, fever and malaise/fatigue.  HENT: Negative for congestion and nosebleeds.   Respiratory: Negative for cough, shortness of breath and wheezing.   Gastrointestinal: Negative for blood in stool, diarrhea, heartburn, melena, nausea and vomiting.  Genitourinary: Negative for hematuria.  Musculoskeletal: Negative for joint pain.  Neurological: Positive for dizziness (mild with orthostasis). Negative for focal weakness and weakness.  Psychiatric/Behavioral: Negative.   All other systems reviewed and are negative.   Past Medical History:  Diagnosis Date  . Shingles    No past surgical history on file.   Current Meds  Medication Sig  . acetaminophen (TYLENOL) 500 MG tablet Take 1,000 mg by mouth every 6 (six) hours as needed for moderate pain.  . Polyethylene Glycol 3350  (MIRALAX PO) Take by mouth as directed.     Allergies:   Patient has no known allergies.   Social History   Tobacco Use  . Smoking status: Never Smoker  . Smokeless tobacco: Never Used  Substance Use Topics  . Alcohol use: No  . Drug use: No     Family Hx: The patient's family history includes COPD in his mother; Hypertension in his father; Liver disease in his father; Lung cancer in his mother. PGF has DM   Prior CV studies:   The following studies were reviewed today: . none  Labs/Other Tests and Data Reviewed:    EKG:  No ECG reviewed.  Recent Labs: 10/29/2018: ALT 10; BUN 16; Creatinine, Ser 1.23; Hemoglobin 13.5; Platelets 178.0; Potassium 4.5; Sodium 140; TSH 0.97   Recent Lipid Panel Lab Results  Component Value Date/Time   CHOL 182 06/01/2018 08:15 AM   TRIG 67.0 06/01/2018 08:15 AM   HDL 44.70 06/01/2018 08:15 AM   CHOLHDL 4 06/01/2018 08:15 AM   LDLCALC 124 (H) 06/01/2018 08:15 AM    Wt Readings from Last 3 Encounters:  11/04/18 195 lb (88.5 kg)  06/03/18 214 lb 12 oz (97.4 kg)  05/26/17 204 lb 6.4 oz (92.7 kg)     Objective:    Vital Signs:  BP 120/80   Pulse 62   Ht 6\' 1"  (1.854 m)   Wt 195 lb (88.5 kg)   BMI 25.73 kg/m   Visual exam only VITAL SIGNS:  reviewed GEN:  Well nourished, well developed male in no acute distress. Well-groomed, well nourished.   RESPIRATORY:  normal respiratory effort, symmetric expansion CARDIOVASCULAR:  no JVD NEURO:  alert and oriented x 3, no obvious focal deficit PSYCH:  normal affect HEENT - EOMI, [place]/AT   ASSESSMENT & PLAN:    Problem List Items Addressed This Visit    Palpitations - Primary    These episodes really sound benign.  They are not very long lasting.  My suspicion is they are probably PACs and PVCs with maybe some short runs of PAT.  I do not think that these episodes are long enough to be true arrhythmia such as atrial fibrillation or flutter. TSH and CBC were checked by PCP and are normal.   I think a more appropriate monitor is probably a 14-day ZIO patch monitor as opposed to Holter monitor.  This gives [place] more time to evaluate. Thankfully we do have the the ability to mail the 14-day ZIO patch out to the patient.  At this point I probably would not do anything to treat as a seem to be relatively benign.  To simply discussed the importance of adequate hydration, avoiding caffeine etc.  Also discussed vagal maneuvers for prolonged episodes.  Depending on what we find, we will likely follow-up with him in 1 or 2 months to discuss results and potential treatment or other necessary evaluations.      Relevant Orders   LONG TERM MONITOR (3-14 DAYS)      COVID-19 Education: The signs and symptoms of COVID-19 were discussed with the patient and how to seek care for testing (follow up with PCP or arrange E-visit).   The importance of social distancing was  discussed today.  Time:   Today, I have spent 16 minutes with the patient with telehealth technology discussing the above problems.     Medication Adjustments/Labs and Tests Ordered: Current medicines are reviewed at length with the patient today.  Concerns regarding medicines are outlined above.  Medication Instructions:   None  Tests Ordered: Orders Placed This Encounter  Procedures  . LONG TERM MONITOR (3-14 DAYS)   14 Day Zio Patch  Medication Changes: No orders of the defined types were placed in this encounter.   Disposition:  Follow up 1-2 months after Monitor    Signed, Glenetta Hew, MD  11/05/2018 6:33 PM    [place]

## 2018-11-04 NOTE — Patient Instructions (Addendum)
Medication Instructions: none  If you need a refill on your cardiac medications before your next appointment, please call your pharmacy.   Lab work: none  If you have labs (blood work) drawn today and your tests are completely normal, you will receive your results only by: Marland Kitchen MyChart Message (if you have MyChart) OR . A paper copy in the mail If you have any lab test that is abnormal or we need to change your treatment, we will call you to review the results.  Testing/Procedures: 14 Day Zio Patch- WIL BE MAILED TO YOU -- Your physician has recommended that you wear an event monitor. Event monitors are medical devices that record the heart's electrical activity. Doctors most often [place] these monitors to diagnose arrhythmias. Arrhythmias are problems with the speed or rhythm of the heartbeat. The monitor is a small, portable device. You can wear one while you do your normal daily activities. This is usually used to diagnose what is causing palpitations/syncope (passing out).   Follow-Up: At [facility], you and your health needs are our priority.  As part of our continuing mission to provide you with exceptional heart care, we have created designated Provider Care Teams.  These Care Teams include your primary Cardiologist (physician) and Advanced Practice Providers (APPs -  Physician Assistants and Nurse Practitioners) who all work together to provide you with the care you need, when you need it. You will need a follow up appointment in 1-2  months.  Please call our office 2 months in advance to schedule this appointment.  You may see Glenetta Hew, MD  or one of the following Advanced Practice Providers on your designated Care Team:   Rosaria Ferries, PA-C . Jory Sims, DNP, ANP    Any Other Special Instructions Will Be Listed Below (If Applicable).

## 2018-11-05 ENCOUNTER — Encounter: Payer: Self-pay | Admitting: Cardiology

## 2018-11-05 ENCOUNTER — Encounter: Payer: Self-pay | Admitting: *Deleted

## 2018-11-05 NOTE — Assessment & Plan Note (Signed)
These episodes really sound benign.  They are not very long lasting.  My suspicion is they are probably PACs and PVCs with maybe some short runs of PAT.  I do not think that these episodes are long enough to be true arrhythmia such as atrial fibrillation or flutter. TSH and CBC were checked by PCP and are normal.  I think a more appropriate monitor is probably a 14-day ZIO patch monitor as opposed to Holter monitor.  This gives [place] more time to evaluate. Thankfully we do have the the ability to mail the 14-day ZIO patch out to the patient.  At this point I probably would not do anything to treat as a seem to be relatively benign.  To simply discussed the importance of adequate hydration, avoiding caffeine etc.  Also discussed vagal maneuvers for prolonged episodes.  Depending on what we find, we will likely follow-up with him in 1 or 2 months to discuss results and potential treatment or other necessary evaluations.

## 2018-11-06 ENCOUNTER — Telehealth: Payer: Self-pay | Admitting: Radiology

## 2018-11-06 NOTE — Telephone Encounter (Signed)
Enrolled patient for a 14 day Zio long term monitor to be mailed. Brief instructions were one over with patient and he knows to expect the monitor to arrive in 3-4 days

## 2018-11-11 ENCOUNTER — Ambulatory Visit (INDEPENDENT_AMBULATORY_CARE_PROVIDER_SITE_OTHER): Payer: BLUE CROSS/BLUE SHIELD

## 2018-11-11 DIAGNOSIS — R002 Palpitations: Secondary | ICD-10-CM | POA: Diagnosis not present

## 2018-11-26 ENCOUNTER — Other Ambulatory Visit: Payer: Self-pay

## 2018-12-28 ENCOUNTER — Telehealth: Payer: Self-pay | Admitting: Cardiology

## 2018-12-28 NOTE — Telephone Encounter (Signed)
Video visit/my chart/pre reg complete/consent obtained -- ttf

## 2018-12-28 NOTE — Telephone Encounter (Signed)
Lm for pt to return call re: ov on Mon 01/04/2019 -- ttf

## 2018-12-30 ENCOUNTER — Telehealth: Payer: Self-pay

## 2018-12-30 NOTE — Telephone Encounter (Signed)
Called pt to change virtual visit to a office visit. Pt did not answer. Left a VM.

## 2019-01-01 ENCOUNTER — Telehealth: Payer: Self-pay | Admitting: Cardiology

## 2019-01-01 NOTE — Telephone Encounter (Signed)

## 2019-01-01 NOTE — Telephone Encounter (Signed)
Called Pt to change virtual visit into a office visit. Pt states that he returned the called and spoke with someone to confirm the changes in the visit.

## 2019-01-04 ENCOUNTER — Ambulatory Visit (INDEPENDENT_AMBULATORY_CARE_PROVIDER_SITE_OTHER): Payer: BC Managed Care – PPO | Admitting: Cardiology

## 2019-01-04 ENCOUNTER — Encounter: Payer: Self-pay | Admitting: Cardiology

## 2019-01-04 ENCOUNTER — Other Ambulatory Visit: Payer: Self-pay

## 2019-01-04 DIAGNOSIS — I480 Paroxysmal atrial fibrillation: Secondary | ICD-10-CM | POA: Diagnosis not present

## 2019-01-04 MED ORDER — ASPIRIN EC 81 MG PO TBEC: 81.0000 mg | DELAYED_RELEASE_TABLET | Freq: Every day | ORAL | 3 refills | Status: DC

## 2019-01-04 NOTE — Progress Notes (Signed)
PCP: Pleas Koch, NP  Clinic Note: Chief Complaint  Patient presents with   Follow-up   Atrial Fibrillation    New diagnosis by event monitor.    HPI: Justin Carson is a 48 y.o. male with a PMH below who presents today for ~2 month f/u after initial evaluation for intermittent palpitations - seen 11/05/2018 at the request of Pleas Koch, NP.  Justin Carson was last seen via TeleHealth Video visit on 11/05/2018 for evaluation of palpitations.  --  irregular heartbeat/palpitation episodes do occur regularly almost every day.  They usually occur in the evening at the end of the day when he starts to settle down to try to go to sleep. Las 10-15 min  Recent Hospitalizations: n/a  Studies Personally - (if available, images/films reviewed: From Epic Chart or Care Everywhere)  CARDIAC EVENT MONITOR: Symptomatic Afib - with intermittent RVR.  Min HR 43 bpm (~5AM) & Avg HR 67 bpm. Max 174 bpm.  Mostly NSR  Prolonged episodes of Atrial Bigeminy noted  ~4% Afib burden (~8 episodes lasting up to 1:22 hr) - rates ranging from 54-185 bpm (Agv HR 94 bpm).  Other than Atrial Bigeminy - rare PACs & PVCs noted.  NO pauses or heart block   Interval History: Justin Carson returns today for follow-up after his monitor.  He says actually his palpitations have gotten a lot better since I last saw him.  He has made a conscious effort to essentially cut out caffeine and sweets.  With doing this, he says the episodes of really happen less frequently and for short durations.  He has not had any dizziness or lightheadedness associated with the spells.  He has no dyspnea.  No syncope or near syncope type symptoms.  No chest pain or shortness of breath with rest or exertion.  No PND, orthopnea or edema. No TIA/amaurosis fugax symptoms. No melena, hematochezia, hematuria, or epstaxis. No claudication.  ROS: See HPI for details Review of Systems  Constitutional: Negative for chills, fever,  malaise/fatigue and weight loss.  Respiratory: Negative for cough and shortness of breath.   Gastrointestinal: Negative for abdominal pain, constipation and heartburn.  Neurological: Negative for dizziness, weakness and headaches.   The patient does not have symptoms concerning for COVID-19 infection (fever, chills, cough, or new shortness of breath).  The patient is practicing social distancing.   COVID-19 Education: The signs and symptoms of COVID-19 were discussed with the patient and how to seek care for testing (follow up with PCP or arrange E-visit).   The importance of social distancing was discussed today.   I have reviewed and (if needed) personally updated the patient's problem list, medications, allergies, past medical and surgical history, social and family history.   Past Medical History:  Diagnosis Date   Shingles     History reviewed. No pertinent surgical history.  Current Meds  Medication Sig   acetaminophen (TYLENOL) 500 MG tablet Take 1,000 mg by mouth every 6 (six) hours as needed for moderate pain.   Polyethylene Glycol 3350 (MIRALAX PO) Take by mouth as directed.    No Known Allergies  Social History   Tobacco Use   Smoking status: Never Smoker   Smokeless tobacco: Never Used  Substance Use Topics   Alcohol use: No   Drug use: No   Social History   Social History Narrative   Married.   3 children.   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    Enjoys spending  time with family.     family history includes COPD in his mother; Hypertension in his father; Liver disease in his father; Lung cancer in his mother.  Wt Readings from Last 3 Encounters:  01/04/19 215 lb 12.8 oz (97.9 kg)  11/04/18 195 lb (88.5 kg)  06/03/18 214 lb 12 oz (97.4 kg)    PHYSICAL EXAM Pulse (!) 57    Temp 97.7 F (36.5 C)    Ht 6' (1.829 m)    Wt 215 lb 12.8 oz (97.9 kg)    BMI 29.27 kg/m  Physical Exam  Constitutional: He is oriented to  person, place, and time. He appears well-developed and well-nourished. No distress.  Well-groomed.  Healthy-appearing  HENT:  Head: Normocephalic and atraumatic.  Neck: Normal range of motion. Neck supple. No hepatojugular reflux and no JVD present. Carotid bruit is not present.  Cardiovascular: Normal rate, regular rhythm, normal heart sounds, intact distal pulses and normal pulses.  Occasional extrasystoles are present. PMI is not displaced. Exam reveals no gallop and no friction rub.  No murmur heard. Pulmonary/Chest: Effort normal and breath sounds normal. No respiratory distress. He has no wheezes. He has no rales.  Abdominal: Soft. Bowel sounds are normal. He exhibits no distension. There is no abdominal tenderness. There is no rebound.  Musculoskeletal: Normal range of motion.        General: No edema.  Neurological: He is alert and oriented to person, place, and time.  Psychiatric: He has a normal mood and affect. His behavior is normal. Judgment and thought content normal.  Vitals reviewed.    Adult ECG Report n/a  Other studies Reviewed: Additional studies/ records that were reviewed today include:  Recent Labs:   Lab Results  Component Value Date   TSH 0.97 10/29/2018    ASSESSMENT / PLAN: Problem List Items Addressed This Visit    PAF (paroxysmal atrial fibrillation) ([place])    Patient a new diagnosis of what seems to be lone A. fib.  He has normal blood pressure.  No diabetes and no diagnosis of CAD. We will do baseline ischemic evaluation with a GXT, and check 2D echocardiogram for structural abnormalities.  Otherwise we will simply continue aspirin and avoid caffeine.  Adequate hydration.  This patients CHA2DS2-VASc Score and unadjusted Ischemic Stroke Rate (% per year) is equal to 0.2 % stroke rate/year from a score of 0  Above score calculated as 1 point each if present [CHF, HTN, DM, Vascular=MI/PAD/Aortic Plaque, Age if 65-74, or Male] Above score calculated as  2 points each if present [Age > 75, or Stroke/TIA/TE]  Per his request, he does not want to try medicines.  I do not think with a resting heart rate of 57 that we should put him on a rate control agent at baseline.  It may come to the point where we discuss PRN flecainide for breakthrough depending on what is ischemic evaluation shows, but for now since he is relatively asymptomatic with these spells and is happy to know a diagnosis, I think we can just monitor unless he becomes notably symptomatic with dyspnea or dizziness..      Relevant Medications   aspirin EC 81 MG tablet   Other Relevant Orders   ECHOCARDIOGRAM COMPLETE   EXERCISE TOLERANCE TEST (ETT)       I spent a total of 25 minutes with the patient and chart review. >  50% of the time was spent in direct patient consultation.   Current medicines are reviewed at  length with the patient today.  (+/- concerns) n/a--prefers to avoid medical management.  Would prefer to monitor. The following changes have been made:  Based on patient request.  We will only add baby aspirin.  Patient Instructions  Medication Instructions:  CONTINUE TAKING 81 MG ASPIRIN ( BABY ASPIRIN ) DAILY If you need a refill on your cardiac medications before your next appointment, please call your pharmacy.   Lab work:  If you have labs (blood work) drawn today and your tests are completely normal, you will receive your results only by:  [place] (if you have MyChart) OR  A paper copy in the mail If you have any lab test that is abnormal or we need to change your treatment, we will call you to review the results.  Testing/Procedures: WILL BE SCHEDULE AT [place] Your physician has requested that you have an echocardiogram. Echocardiography is a painless test that uses sound waves to create images of your heart. It provides your doctor with information about the size and shape of your heart and how well your hearts  chambers and valves are working. This procedure takes approximately one hour. There are no restrictions for this procedure.  AND   WILL BE SCHEDULE AT [place] [address] ONCE SCHEDULE YOU WILL NEED TO HAVE COVID TEST ( [address] - [place]  [address] )  DONE THEN QUARANTINE UNTIL IS COMPLETED. Your physician has requested that you have an exercise tolerance test. For further information please visit [email or web address]. Please also follow instruction sheet, as given.   Follow-Up: At [facility], you and your health needs are our priority.  As part of our continuing mission to provide you with exceptional heart care, we have created designated Provider Care Teams.  These Care Teams include your primary Cardiologist (physician) and Advanced Practice Providers (APPs -  Physician Assistants and Nurse Practitioners) who all work together to provide you with the care you need, when you need it.  You will need a follow up appointment in 8 months-FEB 2021 ( UNLESS ABNORMAL THEN APPT WILL BE SOONER).  Please call our office 2 months in advance to schedule this appointment.  You may see Glenetta Hew, MD or one of the following Advanced Practice Providers on your designated Care Team:    Rosaria Ferries, PA-C  Jory Sims, DNP, ANP  Any Other Special Instructions Will Be Listed Below.  CONTINUE WITH HYDRATION AVOID CAFFIENE    Studies Ordered:   Orders Placed This Encounter  Procedures   EXERCISE TOLERANCE TEST (ETT)   ECHOCARDIOGRAM COMPLETE      Glenetta Hew, M.D., M.S. Interventional Cardiologist   Pager # [phone] Phone # [phone] [address] [place], [place] [address]   Thank you for choosing Heartcare at [facility]!!

## 2019-01-04 NOTE — Patient Instructions (Signed)
Medication Instructions:  CONTINUE TAKING 81 MG ASPIRIN ( BABY ASPIRIN ) DAILY If you need a refill on your cardiac medications before your next appointment, please call your pharmacy.   Lab work:  If you have labs (blood work) drawn today and your tests are completely normal, you will receive your results only by: Marland Kitchen MyChart Message (if you have MyChart) OR . A paper copy in the mail If you have any lab test that is abnormal or we need to change your treatment, we will call you to review the results.  Testing/Procedures: WILL BE SCHEDULE AT [place] Your physician has requested that you have an echocardiogram. Echocardiography is a painless test that uses sound waves to create images of your heart. It provides your doctor with information about the size and shape of your heart and how well your heart's chambers and valves are working. This procedure takes approximately one hour. There are no restrictions for this procedure.  AND   WILL BE SCHEDULE AT [place] [address] ONCE SCHEDULE YOU WILL NEED TO HAVE COVID TEST ( [address] - [place]  [address] )  DONE THEN QUARANTINE UNTIL IS COMPLETED. Your physician has requested that you have an exercise tolerance test. For further information please visit [email or web address]. Please also follow instruction sheet, as given.   Follow-Up: At [facility], you and your health needs are our priority.  As part of our continuing mission to provide you with exceptional heart care, we have created designated Provider Care Teams.  These Care Teams include your primary Cardiologist (physician) and Advanced Practice Providers (APPs -  Physician Assistants and Nurse Practitioners) who all work together to provide you with the care you need, when you need it. . You will need a follow up appointment in 8 months-FEB 2021 ( UNLESS ABNORMAL THEN APPT WILL BE SOONER).  Please call our office 2 months in  advance to schedule this appointment.  You may see Glenetta Hew, MD or one of the following Advanced Practice Providers on your designated Care Team:   . Rosaria Ferries, PA-C . Jory Sims, DNP, ANP  Any Other Special Instructions Will Be Listed Below.  [place]

## 2019-01-05 ENCOUNTER — Encounter: Payer: Self-pay | Admitting: Cardiology

## 2019-01-05 NOTE — Assessment & Plan Note (Signed)
Patient a new diagnosis of what seems to be lone A. fib.  He has normal blood pressure.  No diabetes and no diagnosis of CAD. We will do baseline ischemic evaluation with a GXT, and check 2D echocardiogram for structural abnormalities.  Otherwise we will simply continue aspirin and avoid caffeine.  Adequate hydration.  This patients CHA2DS2-VASc Score and unadjusted Ischemic Stroke Rate (% per year) is equal to 0.2 % stroke rate/year from a score of 0  Above score calculated as 1 point each if present [CHF, HTN, DM, Vascular=MI/PAD/Aortic Plaque, Age if 65-74, or Male] Above score calculated as 2 points each if present [Age > 75, or Stroke/TIA/TE]  Per his request, he does not want to try medicines.  I do not think with a resting heart rate of 57 that we should put him on a rate control agent at baseline.  It may come to the point where we discuss PRN flecainide for breakthrough depending on what is ischemic evaluation shows, but for now since he is relatively asymptomatic with these spells and is happy to know a diagnosis, I think we can just monitor unless he becomes notably symptomatic with dyspnea or dizziness.Marland Kitchen

## 2019-01-06 HISTORY — PX: TRANSTHORACIC ECHOCARDIOGRAM: SHX275

## 2019-01-15 ENCOUNTER — Ambulatory Visit (HOSPITAL_COMMUNITY): Payer: BC Managed Care – PPO | Attending: Cardiology

## 2019-01-15 ENCOUNTER — Other Ambulatory Visit: Payer: Self-pay

## 2019-01-15 DIAGNOSIS — I480 Paroxysmal atrial fibrillation: Secondary | ICD-10-CM | POA: Insufficient documentation

## 2019-02-02 ENCOUNTER — Other Ambulatory Visit: Payer: Self-pay | Admitting: *Deleted

## 2019-02-02 ENCOUNTER — Telehealth: Payer: Self-pay | Admitting: *Deleted

## 2019-02-02 NOTE — Telephone Encounter (Signed)
SPOKE TO PATIENT  AWARE OF COVID TEST  - SCHEDULE FOR 01/09/19 AT 3:20   AT [address]  PATIENT AWARE HE WILL NEED TO SELF ISOLATE AFTER COVID TEST UNTIL GXT  PATIENT VERBALIZED UNDERSTANDING.

## 2019-02-02 NOTE — Progress Notes (Signed)
COVID TEST ORDER 01/09/19 FOR SCHEDULE GXT  01/12/19

## 2019-02-02 NOTE — Telephone Encounter (Signed)
-----   Message from Marcine Matar sent at 01/27/2019 11:05 AM EDT ----- Regarding: COVID test Good Day,    There is a GXT scheduled on Friday, February 12, 2019, a patient of Dr. Ellyn Hack - [facility]   Please call the patient and schedule/order COVID testing at [facility] the patient is aware of self-isolate after the COVID testing is performed until the GXT appointment.   If the patient does not have the COVID testing performed and if we don't have the testing results back prior to the GXT appointment, we will have to cancel the test.   Jari Sportsman

## 2019-02-06 HISTORY — PX: OTHER SURGICAL HISTORY: SHX169

## 2019-02-09 ENCOUNTER — Other Ambulatory Visit (HOSPITAL_COMMUNITY)
Admission: RE | Admit: 2019-02-09 | Discharge: 2019-02-09 | Disposition: A | Discharge status: Home / Self Care | Payer: BC Managed Care – PPO | Source: Ambulatory Visit | Attending: Cardiology | Admitting: Cardiology

## 2019-02-09 DIAGNOSIS — Z20828 Contact with and (suspected) exposure to other viral communicable diseases: Secondary | ICD-10-CM | POA: Diagnosis not present

## 2019-02-09 DIAGNOSIS — Z01812 Encounter for preprocedural laboratory examination: Secondary | ICD-10-CM | POA: Insufficient documentation

## 2019-02-09 LAB — SARS CORONAVIRUS 2 (TAT 6-24 HRS): SARS Coronavirus 2: NEGATIVE

## 2019-02-10 ENCOUNTER — Telehealth (HOSPITAL_COMMUNITY): Payer: Self-pay

## 2019-02-10 NOTE — Telephone Encounter (Signed)
Encounter complete. 

## 2019-02-12 ENCOUNTER — Telehealth: Payer: Self-pay | Admitting: Cardiology

## 2019-02-12 ENCOUNTER — Other Ambulatory Visit: Payer: Self-pay

## 2019-02-12 ENCOUNTER — Ambulatory Visit (HOSPITAL_COMMUNITY)
Admission: RE | Admit: 2019-02-12 | Discharge: 2019-02-12 | Disposition: A | Discharge status: Home / Self Care | Payer: BC Managed Care – PPO | Source: Ambulatory Visit | Attending: Cardiovascular Disease | Admitting: Cardiovascular Disease

## 2019-02-12 DIAGNOSIS — I480 Paroxysmal atrial fibrillation: Secondary | ICD-10-CM | POA: Diagnosis not present

## 2019-02-12 LAB — EXERCISE TOLERANCE TEST
Estimated workload: 16 METS
Exercise duration (min): 13 min
Exercise duration (sec): 26 s
MPHR: 171 {beats}/min
Peak HR: 162 {beats}/min
Percent HR: 94 %
Rest HR: 51 {beats}/min

## 2019-02-12 NOTE — Telephone Encounter (Signed)
I am stress test reader today and read the ETT on Mr. Justin Carson. It was very concerning given ST elevations at peak exercise. I attempted to call x2, and then Mr. Justin Carson returned my call. He confirmed that he was asymptomatic during the test and feels fine now. I told him that I believed his stress test was abnormal, and I gave him strict red flag warning signs that need immediate medical attention. I instructed him to avoid heavy exertion until he discusses results with Dr. Ellyn Hack. He appreciated the call and will follow the recommendations.  I have also communicated this to Dr. Ellyn Hack.  Buford Dresser, MD, PhD [facility]  [address] [place], [place] [address] [phone]

## 2019-02-15 ENCOUNTER — Other Ambulatory Visit: Payer: Self-pay

## 2019-02-15 DIAGNOSIS — R079 Chest pain, unspecified: Secondary | ICD-10-CM

## 2019-02-15 NOTE — Progress Notes (Signed)
Notes recorded by Leonie Man, MD on 02/15/2019 at 2:50 PM EDT  Interesting Stress Test results --> The fact that you achieved 13 min exercise without symptoms is reassuring, however there were definitely some EKG abnormalities that are a bit concerning.   As Dr. Harrell Gave discussed - if you were to have any symptoms of chest discomfort or shortness of breath with exertion that does not go away with rest - go to ER. If it goes away with rest, let us know ASAP & we would probably plan to evaluate with an invasive Cardiac Catheterization to evaluate your heart arteries.    Otherwise, if you are not having symptoms - we have the option of doing a Nuclear Stress Test (for which we use a medicine to essentially fake the heart into acting as if it is being stressed) that looks at blood flow to the heart during stress vs. Rest.  The other option is a Non-Stress Test heart artery evaluation - a CT scan with XRay contrast that lets [place] look at the heart arteries to see if there are any potential blockages/narrowings that could be responsible for the abnormal EKG stress test.    Of the two - with the potential areas of concern indicated by the EKG abnormalities, I think that the CT Scan test (Cardiac CT Angiogram) is the better of the 2.    I am out of the office this week - will ask my RN to contact you about potentially scheduling the Cardiac CT Angiogram & arrange for a f/u appointment to discuss the results.   Glenetta Hew, MD

## 2019-02-15 NOTE — Addendum Note (Signed)
Addended by: Waylan Rocher on: 02/15/2019 04:33 PM   Modules accepted: Orders

## 2019-02-16 NOTE — Telephone Encounter (Signed)
Response to ? Re appropriate next test after Abnormal GXT:  So - I tried to convey in my review of the study that both the Nuclear Stress Test (would be the IV Stress Test) & the Coronary CTA are options.  I actually think that the CT is more definitive -- less likely to have a False Negative result.  What we are looking for is a blockage/narrowing in a heart artery & based upon the changes seen on Stress EKG (which could simply be related to heart rate & are not as accurate as imaging) would correlate with arteries (very proximal Main Branches) that may be missed with a Nuclear Stress Test -- read as negative b/c a large area is evenly affected & could be read as even distribution of flow.  The CT scan looks at [facility] contrast filling the arteries & if a potential narrowing is identified, the study is then evaluated off-line measuring changes in flow rates across the narrowing to determine if it is "significant" or not.  This essentially is what a Stress Test looks for, but is looking at flow in a individual artery & not globally (very similar to how we evaluate these narrowings/blockages or "lesions" in the Cardiac Cath Lab invasively).    I tend to favor the CT as more accurate when I am concerned with possible False Negatives - mostly to ensure that we do not miss the more concerning Proximal (high-up) blockages.  If anything, these tests Err on the side of caution & overcall -- I.e. less chance of False Negative at the expense of more False Positives.  Hope this makes sense.  Glenetta Hew, MD

## 2019-02-28 DIAGNOSIS — W57XXXA Bitten or stung by nonvenomous insect and other nonvenomous arthropods, initial encounter: Secondary | ICD-10-CM

## 2019-02-28 DIAGNOSIS — I480 Paroxysmal atrial fibrillation: Secondary | ICD-10-CM

## 2019-03-09 HISTORY — PX: OTHER SURGICAL HISTORY: SHX169

## 2019-03-11 ENCOUNTER — Other Ambulatory Visit (INDEPENDENT_AMBULATORY_CARE_PROVIDER_SITE_OTHER): Payer: BC Managed Care – PPO

## 2019-03-11 ENCOUNTER — Other Ambulatory Visit: Payer: Self-pay

## 2019-03-11 DIAGNOSIS — W57XXXD Bitten or stung by nonvenomous insect and other nonvenomous arthropods, subsequent encounter: Secondary | ICD-10-CM | POA: Diagnosis not present

## 2019-03-11 DIAGNOSIS — T148XXD Other injury of unspecified body region, subsequent encounter: Secondary | ICD-10-CM | POA: Diagnosis not present

## 2019-03-11 DIAGNOSIS — I480 Paroxysmal atrial fibrillation: Secondary | ICD-10-CM

## 2019-03-11 DIAGNOSIS — W57XXXA Bitten or stung by nonvenomous insect and other nonvenomous arthropods, initial encounter: Secondary | ICD-10-CM

## 2019-03-17 LAB — ROCKY MTN SPOTTED FVR ABS PNL(IGG+IGM)
RMSF IgG: NOT DETECTED
RMSF IgM: NOT DETECTED

## 2019-03-17 LAB — B. BURGDORFI ANTIBODIES BY WB

## 2019-03-18 ENCOUNTER — Telehealth (HOSPITAL_COMMUNITY): Payer: Self-pay | Admitting: Emergency Medicine

## 2019-03-18 NOTE — Telephone Encounter (Signed)
Left message on voicemail with name and callback number Emilyann Banka RN Navigator Cardiac Imaging  Heart [facility] V[facility] Services [phone] Office [phone] Cell  

## 2019-03-19 ENCOUNTER — Encounter (HOSPITAL_COMMUNITY): Payer: Self-pay

## 2019-03-19 ENCOUNTER — Ambulatory Visit (HOSPITAL_COMMUNITY)
Admission: RE | Admit: 2019-03-19 | Discharge: 2019-03-19 | Disposition: A | Discharge status: Home / Self Care | Payer: BC Managed Care – PPO | Source: Ambulatory Visit | Attending: Cardiology | Admitting: Cardiology

## 2019-03-19 ENCOUNTER — Other Ambulatory Visit: Payer: Self-pay

## 2019-03-19 DIAGNOSIS — R079 Chest pain, unspecified: Secondary | ICD-10-CM

## 2019-03-19 MED ORDER — NITROGLYCERIN 0.4 MG SL SUBL
0.8000 mg | SUBLINGUAL_TABLET | Freq: Once | SUBLINGUAL | Status: AC
  Administered 2019-03-19: 13:00:00 0.8 mg via SUBLINGUAL
  Filled 2019-03-19: qty 25

## 2019-03-19 MED ORDER — IOHEXOL 350 MG/ML SOLN
80.0000 mL | Freq: Once | INTRAVENOUS | Status: AC | PRN
  Administered 2019-03-19: 13:00:00 80 mL via INTRAVENOUS

## 2019-03-19 MED ORDER — NITROGLYCERIN 0.4 MG SL SUBL
SUBLINGUAL_TABLET | SUBLINGUAL | Status: AC
  Administered 2019-03-19: 13:00:00 0.8 mg via SUBLINGUAL
  Filled 2019-03-19: qty 2

## 2019-03-19 NOTE — Progress Notes (Signed)
Pt tolerated exam without incident.  PIV removed, dressing applied.  RAC infiltrated site with dressing, CDI.  Infiltrated with 5cc NS.  No pain at this time.  Discharge instructions discussed, questions answered.  Pt discharged

## 2019-03-19 NOTE — Discharge Instructions (Signed)
Testing With IV Contrast Material °IV contrast material is a fluid that is used with some imaging tests. It is injected into your body through a vein. Contrast material is used when your health care providers need a detailed look at organs, tissues, or blood vessels that may not show up with the standard test. The material may be used when an X-ray, an MRI, a CT scan, or an ultrasound is done. °IV contrast material may be used for imaging tests that check: °· Muscles, skin, and fat. °· Breasts. °· Brain. °· Digestive tract. °· Heart. °· Organs such as the liver, kidneys, lungs, bladder, and many others. °· Arteries and veins. °Tell a health care provider about: °· Any allergies you have, especially an allergy to contrast material. °· All medicines you are taking, including metformin, beta blockers, NSAIDs (such as ibuprofen), interleukin-2, vitamins, herbs, eye drops, creams, and over-the-counter medicines. °· Any problems you or family members have had with the use of contrast material. °· Any blood disorders you have, such as sickle cell anemia. °· Any surgeries you have had. °· Any medical conditions you have or have had, especially alcohol abuse, dehydration, asthma, or kidney, liver, or heart problems. °· Whether you are pregnant or may be pregnant. °· Whether you are breastfeeding. Most contrast materials are safe for use in breastfeeding women. °What are the risks? °Generally, this is a safe procedure. However, problems may occur, including: °· Headache. °· Itching, skin rash, and hives. °· Nausea and vomiting. °· Allergic reactions. °· Wheezing or difficulty breathing. °· Abnormal heart rate. °· Changes in blood pressure. °· Throat swelling. °· Kidney damage. °What happens before the procedure? °Medicines °Ask your health care provider about: °· Changing or stopping your regular medicines. This is especially important if you are taking diabetes medicines or blood thinners. °· Taking medicines such as aspirin  and ibuprofen. These medicines can thin your blood. Do not take these medicines unless your health care provider tells you to take them. °· Taking over-the-counter medicines, vitamins, herbs, and supplements. °If you are at risk of having a reaction to the IV contrast material, you may be asked to take medicine before the procedure to prevent a reaction. °General instructions °· Follow instructions from your health care provider about eating or drinking restrictions. °· You may have an exam or lab tests to make sure that you can safely get IV contrast material. °· Ask if you will be given a medicine to help you relax (sedative) during the procedure. If so, plan to have someone take you home from the hospital or clinic. °What happens during the procedure? °· You may be given a sedative to help you relax. °· An IV will be inserted into one of your veins. °· Contrast material will be injected into your IV. °· You may feel warmth or flushing as the contrast material enters your bloodstream. °· You may have a metallic taste in your mouth for a few minutes. °· The needle may cause some discomfort and bruising. °· After the contrast material is in your body, the imaging test will be done. °The procedure may vary among health care providers and hospitals. °What can I expect after the procedure? °· The IV will be removed. °· You may be taken to a recovery area if sedation medicines were used. Your blood pressure, heart rate, breathing rate, and blood oxygen level will be monitored until you leave the hospital or clinic. °Follow these instructions at home: ° °· Take over-the-counter and   prescription medicines only as told by your health care provider. °? Your health care provider may tell you to not take certain medicines for a couple of days after the procedure. This is especially important if you are taking diabetes medicines. °· If you are told, drink enough fluid to keep your urine pale yellow. This will help to remove  the contrast material out of your body. °· Do not drive for 24 hours if you were given a sedative during your procedure. °· It is up to you to get the results of your procedure. Ask your health care provider, or the department that is doing the procedure, when your results will be ready. °· Keep all follow-up visits as told by your health care provider. This is important. °Contact a health care provider if: °· You have redness, swelling, or pain near your IV site. °Get help right away if: °· You have an abnormal heart rhythm. °· You have trouble breathing. °· You have: °? Chest pain. °? Pain in your back, neck, arm, jaw, or stomach. °? Nausea or sweating. °? Hives or a rash. °· You start shaking and cannot stop. °These symptoms may represent a serious problem that is an emergency. Do not wait to see if the symptoms will go away. Get medical help right away. Call your local emergency services (911 in the [place] Do not drive yourself to the hospital. °Summary °· IV contrast material may be used for imaging tests to help your health care providers see your organs and tissues more clearly. °· Tell your health care provider if you are pregnant or may be pregnant. °· During the procedure, you may feel warmth or flushing as the contrast material enters your bloodstream. °· After the procedure, drink enough fluid to keep your urine pale yellow. °This information is not intended to replace advice given to you by your health care provider. Make sure you discuss any questions you have with your health care provider. °Document Released: 06/12/2009 Document Revised: 09/10/2018 Document Reviewed: 09/10/2018 °Elsevier Patient Education © 2020 [employer] Inc[employer] ° ° °Cardiac CT Angiogram ° °A cardiac CT angiogram is a procedure to look at the heart and the area around the heart. It may be done to help find the cause of chest pains or other symptoms of heart disease. During this procedure, a large X-ray machine, called a CT scanner,  takes detailed pictures of the heart and the surrounding area after a dye (contrast material) has been injected into blood vessels in the area. The procedure is also sometimes called a coronary CT angiogram, coronary artery scanning, or CTA. °A cardiac CT angiogram allows the health care provider to see how well blood is flowing to and from the heart. The health care provider will be able to see if there are any problems, such as: °· Blockage or narrowing of the coronary arteries in the heart. °· Fluid around the heart. °· Signs of weakness or disease in the muscles, valves, and tissues of the heart. °Tell a health care provider about: °· Any allergies you have. This is especially important if you have had a previous allergic reaction to contrast dye. °· All medicines you are taking, including vitamins, herbs, eye drops, creams, and over-the-counter medicines. °· Any blood disorders you have. °· Any surgeries you have had. °· Any medical conditions you have. °· Whether you are pregnant or may be pregnant. °· Any anxiety disorders, chronic pain, or other conditions you have that may increase your stress or prevent   you from lying still. °What are the risks? °Generally, this is a safe procedure. However, problems may occur, including: °· Bleeding. °· Infection. °· Allergic reactions to medicines or dyes. °· Damage to other structures or organs. °· Kidney damage from the dye or contrast that is used. °· Increased risk of cancer from radiation exposure. This risk is low. Talk with your health care provider about: °? The risks and benefits of testing. °? How you can receive the lowest dose of radiation. °What happens before the procedure? °· Wear comfortable clothing and remove any jewelry, glasses, dentures, and hearing aids. °· Follow instructions from your health care provider about eating and drinking. This may include: °? For 12 hours before the test -- avoid caffeine. This includes tea, coffee, soda, energy drinks,  and diet pills. Drink plenty of water or other fluids that do not have caffeine in them. Being well-hydrated can prevent complications. °? For 4-6 hours before the test -- stop eating and drinking. The contrast dye can cause nausea, but this is less likely if your stomach is empty. °· Ask your health care provider about changing or stopping your regular medicines. This is especially important if you are taking diabetes medicines, blood thinners, or medicines to treat erectile dysfunction. °What happens during the procedure? °· Hair on your chest may need to be removed so that small sticky patches called electrodes can be placed on your chest. These will transmit information that helps to monitor your heart during the test. °· An IV tube will be inserted into one of your veins. °· You might be given a medicine to control your heart rate during the test. This will help to ensure that good images are obtained. °· You will be asked to lie on an exam table. This table will slide in and out of the CT machine during the procedure. °· Contrast dye will be injected into the IV tube. You might feel warm, or you may get a metallic taste in your mouth. °· You will be given a medicine (nitroglycerin) to relax (dilate) the arteries in your heart. °· The table that you are lying on will move into the CT machine tunnel for the scan. °· The person running the machine will give you instructions while the scans are being done. You may be asked to: °? Keep your arms above your head. °? Hold your breath. °? Stay very still, even if the table is moving. °· When the scanning is complete, you will be moved out of the machine. °· The IV tube will be removed. °The procedure may vary among health care providers and hospitals. °What happens after the procedure? °· You might feel warm, or you may get a metallic taste in your mouth from the contrast dye. °· You may have a headache from the nitroglycerin. °· After the procedure, drink water or  other fluids to wash (flush) the contrast material out of your body. °· Contact a health care provider if you have any symptoms of allergy to the contrast. These symptoms include: °? Shortness of breath. °? Rash or hives. °? A racing heartbeat. °· Most people can return to their normal activities right after the procedure. Ask your health care provider what activities are safe for you. °· It is up to you to get the results of your procedure. Ask your health care provider, or the department that is doing the procedure, when your results will be ready. °Summary °· A cardiac CT angiogram is a procedure to   look at the heart and the area around the heart. It may be done to help find the cause of chest pains or other symptoms of heart disease. °· During this procedure, a large X-ray machine, called a CT scanner, takes detailed pictures of the heart and the surrounding area after a dye (contrast material) has been injected into blood vessels in the area. °· Ask your health care provider about changing or stopping your regular medicines before the procedure. This is especially important if you are taking diabetes medicines, blood thinners, or medicines to treat erectile dysfunction. °· After the procedure, drink water or other fluids to wash (flush) the contrast material out of your body. °This information is not intended to replace advice given to you by your health care provider. Make sure you discuss any questions you have with your health care provider. °Document Released: 06/06/2008 Document Revised: 06/06/2017 Document Reviewed: 05/13/2016 °Elsevier Patient Education © 2020 Elsevier Inc. ° °

## 2019-06-30 ENCOUNTER — Other Ambulatory Visit: Payer: Self-pay

## 2019-06-30 ENCOUNTER — Ambulatory Visit (INDEPENDENT_AMBULATORY_CARE_PROVIDER_SITE_OTHER): Payer: BC Managed Care – PPO | Admitting: Orthopaedic Surgery

## 2019-06-30 ENCOUNTER — Ambulatory Visit: Payer: Self-pay

## 2019-06-30 ENCOUNTER — Encounter: Payer: Self-pay | Admitting: Orthopaedic Surgery

## 2019-06-30 DIAGNOSIS — M25521 Pain in right elbow: Secondary | ICD-10-CM

## 2019-06-30 MED ORDER — DICLOFENAC SODIUM 75 MG PO TBEC: 75.0000 mg | DELAYED_RELEASE_TABLET | Freq: Two times a day (BID) | ORAL | 2 refills | Status: DC

## 2019-06-30 NOTE — Progress Notes (Signed)
Office Visit Note   Patient: Justin Carson           Date of Birth: Oct 28, 1970           MRN: [id number] Visit Date: 06/30/2019              Requested by: Pleas Koch, NP [place],  [place] [id number] PCP: Pleas Koch, NP   Assessment & Plan: Visit Diagnoses:  1. Pain in right elbow     Plan: Impression is right elbow pain.  X-rays are nondiagnostic did not show any evidence of DJD.  My thoughts are that he has an inflammatory process likely overuse tendinitis.  I would like to give him a trial of 2 weeks of diclofenac.  He will also rest.  He understands that he should return as if he does not notice any improvement.  We will consider an intra-articular joint injection if he fails to improve from the oral medications.  Follow-Up Instructions: Return if symptoms worsen or fail to improve.   Orders:  Orders Placed This Encounter  Procedures  . XR Elbow Complete Right (3+View)   Meds ordered this encounter  Medications  . diclofenac (VOLTAREN) 75 MG EC tablet    Sig: Take 1 tablet (75 mg total) by mouth 2 (two) times daily.    Dispense:  30 tablet    Refill:  2      Procedures: No procedures performed   Clinical Data: No additional findings.   Subjective: Chief Complaint  Patient presents with  . Right Elbow - Pain    Justin Carson Is a 48 year old right-hand-dominant gentleman who comes in for evaluation of right elbow pain feels deep in his antecubital fossa.  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  He denies any numbness and tingling or radiating pain in his forearm or hand.  He has taken some over-the-counter medicines.  Rest does help.   Review of Systems  Constitutional: Negative.   All other systems reviewed and are negative.    Objective: Vital Signs: There were no vitals taken for this visit.  Physical Exam Vitals and nursing note reviewed.  Constitutional:       Appearance: He is well-developed.  HENT:     Head: Normocephalic and atraumatic.  Eyes:     Pupils: Pupils are equal, round, and reactive to light.  Pulmonary:     Effort: Pulmonary effort is normal.  Abdominal:     Palpations: Abdomen is soft.  Musculoskeletal:        General: Normal range of motion.     Cervical back: Neck supple.  Skin:    General: Skin is warm.  Neurological:     Mental Status: He is alert and oriented to person, place, and time.  Psychiatric:        Behavior: Behavior normal.        Thought Content: Thought content normal.        Judgment: Judgment normal.     Ortho Exam Right elbow exam shows no swelling masses or lesions.  Full range of motion without pain.  He has no palpable masses in his antecubital fossa.  There is no reproducible pain with palpation.  No focal motor or sensory deficits.  Distal biceps tendon is nontender.  No pain with resisted supination or pronation or elbow flexion.  Specialty Comments:  No specialty comments available.  Imaging: XR Elbow Complete  Right (3+View)  Result Date: 06/30/2019 No acute or structural abnormalities.    PMFS History: Patient Active Problem List   Diagnosis Date Noted  . PAF (paroxysmal atrial fibrillation) ([place]) 01/04/2019  . Palpitations 10/29/2018  . Elevated AST (SGOT) 06/03/2018  . Preventative health care 05/26/2017   Past Medical History:  Diagnosis Date  . Shingles     Family History  Problem Relation Age of Onset  . Lung cancer Mother   . COPD Mother   . Hypertension Father   . Liver disease Father     History reviewed. No pertinent surgical history. Social History   Occupational History  . Not on file  Tobacco Use  . Smoking status: Never Smoker  . Smokeless tobacco: Never Used  Substance and Sexual Activity  . Alcohol use: No  . Drug use: No  . Sexual activity: Not on file

## 2019-07-06 ENCOUNTER — Encounter: Payer: BC Managed Care – PPO | Admitting: Primary Care

## 2019-07-06 ENCOUNTER — Ambulatory Visit: Payer: BC Managed Care – PPO | Admitting: Primary Care

## 2019-07-08 ENCOUNTER — Encounter: Payer: Self-pay | Admitting: Primary Care

## 2019-07-08 ENCOUNTER — Other Ambulatory Visit: Payer: Self-pay

## 2019-07-08 ENCOUNTER — Ambulatory Visit (INDEPENDENT_AMBULATORY_CARE_PROVIDER_SITE_OTHER): Payer: BC Managed Care – PPO | Admitting: Primary Care

## 2019-07-08 VITALS — BP 118/70 | HR 58 | Temp 96.4°F | Ht 72.0 in | Wt 214.0 lb

## 2019-07-08 DIAGNOSIS — Z125 Encounter for screening for malignant neoplasm of prostate: Secondary | ICD-10-CM

## 2019-07-08 DIAGNOSIS — Z1211 Encounter for screening for malignant neoplasm of colon: Secondary | ICD-10-CM | POA: Diagnosis not present

## 2019-07-08 DIAGNOSIS — R002 Palpitations: Secondary | ICD-10-CM

## 2019-07-08 DIAGNOSIS — Z0000 Encounter for general adult medical examination without abnormal findings: Secondary | ICD-10-CM

## 2019-07-08 DIAGNOSIS — I480 Paroxysmal atrial fibrillation: Secondary | ICD-10-CM | POA: Diagnosis not present

## 2019-07-08 DIAGNOSIS — R7401 Elevation of levels of liver transaminase levels: Secondary | ICD-10-CM

## 2019-07-08 LAB — COMPREHENSIVE METABOLIC PANEL
ALT: 47 U/L (ref 0–53)
AST: 36 U/L (ref 0–37)
Albumin: 4.5 g/dL (ref 3.5–5.2)
Alkaline Phosphatase: 53 U/L (ref 39–117)
BUN: 15 mg/dL (ref 6–23)
CO2: 28 mEq/L (ref 19–32)
Calcium: 9.7 mg/dL (ref 8.4–10.5)
Chloride: 106 mEq/L (ref 96–112)
Creatinine, Ser: 1.22 mg/dL (ref 0.40–1.50)
GFR: 62.87 mL/min (ref >60.00)
Glucose, Bld: 93 mg/dL (ref 70–99)
Potassium: 4.8 mEq/L (ref 3.5–5.1)
Sodium: 140 mEq/L (ref 135–145)
Total Bilirubin: 1.2 mg/dL (ref 0.2–1.2)
Total Protein: 6.7 g/dL (ref 6.0–8.3)

## 2019-07-08 LAB — PSA: PSA: 0.69 ng/mL (ref 0.10–4.00)

## 2019-07-08 LAB — LIPID PANEL
Cholesterol: 196 mg/dL (ref 0–200)
HDL: 44.7 mg/dL
LDL Cholesterol: 138 mg/dL — ABNORMAL HIGH (ref 0–99)
NonHDL: 151.13
Total CHOL/HDL Ratio: 4
Triglycerides: 67 mg/dL (ref 0.0–149.0)
VLDL: 13.4 mg/dL (ref 0.0–40.0)

## 2019-07-08 NOTE — Assessment & Plan Note (Signed)
Repeat LFTs pending. 

## 2019-07-08 NOTE — Patient Instructions (Addendum)
Stop by the lab prior to leaving today. I will notify you of your results once received.   Continue exercising. You should be getting 150 minutes of moderate intensity exercise weekly.  It's important to improve your diet by reducing consumption of fast food, fried food, processed snack foods, sugary drinks. Increase consumption of fresh vegetables and fruits, whole grains, water.  Ensure you are drinking 64 ounces of water daily.  It was a pleasure to see you today!   Preventive Care 50-85 Years Old, Male Preventive care refers to lifestyle choices and visits with your health care provider that can promote health and wellness. This includes:  A yearly physical exam. This is also called an annual well check.  Regular dental and eye exams.  Immunizations.  Screening for certain conditions.  Healthy lifestyle choices, such as eating a healthy diet, getting regular exercise, not using drugs or products that contain nicotine and tobacco, and limiting alcohol use. What can I expect for my preventive care visit? Physical exam Your health care provider will check:  Height and weight. These may be used to calculate body mass index (BMI), which is a measurement that tells if you are at a healthy weight.  Heart rate and blood pressure.  Your skin for abnormal spots. Counseling Your health care provider may ask you questions about:  Alcohol, tobacco, and drug use.  Emotional well-being.  Home and relationship well-being.  Sexual activity.  Eating habits.  Work and work Statistician. What immunizations do I need?  Influenza (flu) vaccine  This is recommended every year. Tetanus, diphtheria, and pertussis (Tdap) vaccine  You may need a Td booster every 10 years. Varicella (chickenpox) vaccine  You may need this vaccine if you have not already been vaccinated. Zoster (shingles) vaccine  You may need this after age 50. Measles, mumps, and rubella (MMR) vaccine  You may  need at least one dose of MMR if you were born in 1957 or later. You may also need a second dose. Pneumococcal conjugate (PCV13) vaccine  You may need this if you have certain conditions and were not previously vaccinated. Pneumococcal polysaccharide (PPSV23) vaccine  You may need one or two doses if you smoke cigarettes or if you have certain conditions. Meningococcal conjugate (MenACWY) vaccine  You may need this if you have certain conditions. Hepatitis A vaccine  You may need this if you have certain conditions or if you travel or work in places where you may be exposed to hepatitis A. Hepatitis B vaccine  You may need this if you have certain conditions or if you travel or work in places where you may be exposed to hepatitis B. Haemophilus influenzae type b (Hib) vaccine  You may need this if you have certain risk factors. Human papillomavirus (HPV) vaccine  If recommended by your health care provider, you may need three doses over 6 months. You may receive vaccines as individual doses or as more than one vaccine together in one shot (combination vaccines). Talk with your health care provider about the risks and benefits of combination vaccines. What tests do I need? Blood tests  Lipid and cholesterol levels. These may be checked every 5 years, or more frequently if you are over 73 years old.  Hepatitis C test.  Hepatitis B test. Screening  Lung cancer screening. You may have this screening every year starting at age 50 if you have a 30-pack-year history of smoking and currently smoke or have quit within the past 15 years.  Prostate cancer screening. Recommendations will vary depending on your family history and other risks.  Colorectal cancer screening. All adults should have this screening starting at age 50 and continuing until age 2. Your health care provider may recommend screening at age 50 if you are at increased risk. You will have tests every 1-10 years, depending  on your results and the type of screening test.  Diabetes screening. This is done by checking your blood sugar (glucose) after you have not eaten for a while (fasting). You may have this done every 1-3 years.  Sexually transmitted disease (STD) testing. Follow these instructions at home: Eating and drinking  Eat a diet that includes fresh fruits and vegetables, whole grains, lean protein, and low-fat dairy products.  Take vitamin and mineral supplements as recommended by your health care provider.  Do not drink alcohol if your health care provider tells you not to drink.  If you drink alcohol: ? Limit how much you have to 0-2 drinks a day. ? Be aware of how much alcohol is in your drink. In the [place] one drink equals one 12 oz bottle of beer (355 mL), one 5 oz glass of wine (148 mL), or one 1 oz glass of hard liquor (44 mL). Lifestyle  Take daily care of your teeth and gums.  Stay active. Exercise for at least 30 minutes on 5 or more days each week.  Do not use any products that contain nicotine or tobacco, such as cigarettes, e-cigarettes, and chewing tobacco. If you need help quitting, ask your health care provider.  If you are sexually active, practice safe sex. Use a condom or other form of protection to prevent STIs (sexually transmitted infections).  Talk with your health care provider about taking a low-dose aspirin every day starting at age 50. What's next?  Go to your health care provider once a year for a well check visit.  Ask your health care provider how often you should have your eyes and teeth checked.  Stay up to date on all vaccines. This information is not intended to replace advice given to you by your health care provider. Make sure you discuss any questions you have with your health care provider. Document Revised: 06/18/2018 Document Reviewed: 06/18/2018 Elsevier Patient Education  2020 [employer].

## 2019-07-08 NOTE — Assessment & Plan Note (Signed)
Immunizations UTD. He declines Shingrix for now as he will be getting his Covid-19 vaccines shortly. He will call to schedule Shingrix once he's completed the Covid vaccine doses, would recommend at least one month after last dose of Covid.  PSA pending. Colonoscopy due, referral to GI placed. Encouraged a healthy diet, regular exercise. Exam today unremarkable. Labs pending.

## 2019-07-08 NOTE — Assessment & Plan Note (Signed)
Denies recent palpitations. Rate and rhythm regular today. Continue to monitor.

## 2019-07-08 NOTE — Progress Notes (Signed)
Subjective:    Patient ID: Justin Carson, male    DOB: [birth year], 48 y.o.   MRN: [id number]  HPI  Justin Carson is a 48 year old male who presents today for complete physical.  Immunizations: -Tetanus: Completed in 2018 -Influenza: Completed this season  -Shingles: Declines  Diet: He endorses a fair diet. Exercise: He is doing cardio and weights three days weekly  Eye exam: No recent exam Dental exam: Completes semi-annually   Colonoscopy: Never completed  PSA: Pending  BP Readings from Last 3 Encounters:  07/08/19 118/70  03/19/19 112/60  11/04/18 120/80     Review of Systems  Constitutional: Negative for unexpected weight change.  HENT: Negative for rhinorrhea.   Respiratory: Negative for cough and shortness of breath.   Cardiovascular: Negative for chest pain.  Gastrointestinal: Negative for constipation and diarrhea.  Genitourinary: Negative for difficulty urinating.  Musculoskeletal: Positive for arthralgias.  Skin: Negative for rash.  Allergic/Immunologic: Negative for environmental allergies.  Neurological: Negative for dizziness, numbness and headaches.  Psychiatric/Behavioral: The patient is not nervous/anxious.        Past Medical History:  Diagnosis Date  . Shingles      Social History   Socioeconomic History  . Marital status: Married    Spouse name: Not on file  . Number of children: 3  . Years of education: Not on file  . Highest education level: Not on file  Occupational History  . Not on file  Tobacco Use  . Smoking status: Never Smoker  . Smokeless tobacco: Never Used  Substance and Sexual Activity  . Alcohol use: No  . Drug use: No  . Sexual activity: Not on file  Other Topics Concern  . Not on file  Social History Narrative   Married.   3 children.   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    Enjoys spending time with family.    Social Determinants of Health   Financial Resource Strain:   .  Difficulty of Paying Living Expenses: Not on file  Food Insecurity:   . Worried About Charity fundraiser in the Last Year: Not on file  . Ran Out of Food in the Last Year: Not on file  Transportation Needs:   . Lack of Transportation (Medical): Not on file  . Lack of Transportation (Non-Medical): Not on file  Physical Activity:   . Days of Exercise per Week: Not on file  . Minutes of Exercise per Session: Not on file  Stress:   . Feeling of Stress : Not on file  Social Connections:   . Frequency of Communication with Friends and Family: Not on file  . Frequency of Social Gatherings with Friends and Family: Not on file  . Attends Religious Services: Not on file  . Active Member of Clubs or Organizations: Not on file  . Attends Archivist Meetings: Not on file  . Marital Status: Not on file  Intimate Partner Violence:   . Fear of Current or Ex-Partner: Not on file  . Emotionally Abused: Not on file  . Physically Abused: Not on file  . Sexually Abused: Not on file    No past surgical history on file.  Family History  Problem Relation Age of Onset  . Lung cancer Mother   . COPD Mother   . Hypertension Father   . Liver disease Father     No Known Allergies  Current Outpatient Medications on File Prior to Visit  Medication Sig Dispense Refill  . acetaminophen (TYLENOL) 500 MG tablet Take 1,000 mg by mouth every 6 (six) hours as needed for moderate pain.    Marland Kitchen aspirin EC 81 MG tablet Take 1 tablet (81 mg total) by mouth daily. 90 tablet 3  . diclofenac (VOLTAREN) 75 MG EC tablet Take 1 tablet (75 mg total) by mouth 2 (two) times daily. 30 tablet 2  . Polyethylene Glycol 3350 (MIRALAX PO) Take by mouth as directed.     No current facility-administered medications on file prior to visit.    BP 118/70   Pulse (!) 58   Temp (!) 96.4 F (35.8 C) (Temporal)   Ht 6' (1.829 m)   Wt 214 lb (97.1 kg)   SpO2 96%   BMI 29.02 kg/m    Objective:   Physical Exam    Constitutional: He is oriented to person, place, and time. He appears well-nourished.  HENT:  Right Ear: Tympanic membrane and ear canal normal.  Left Ear: Tympanic membrane and ear canal normal.  Mouth/Throat: Oropharynx is clear and moist.  Eyes: Pupils are equal, round, and reactive to light. EOM are normal.  Cardiovascular: Normal rate and regular rhythm.  Respiratory: Effort normal and breath sounds normal.  GI: Soft. Bowel sounds are normal. There is no abdominal tenderness.  Musculoskeletal:        General: Normal range of motion.     Cervical back: Neck supple.  Neurological: He is alert and oriented to person, place, and time. No cranial nerve deficit.  Reflex Scores:      Patellar reflexes are 2+ on the right side and 2+ on the left side. Skin: Skin is warm and dry.  Psychiatric: He has a normal mood and affect.           Assessment & Plan:

## 2019-07-08 NOTE — Assessment & Plan Note (Signed)
Rate and rhythm regular today. Patient denies palpitations. Continue to monitor.

## 2019-07-12 ENCOUNTER — Encounter: Payer: Self-pay | Admitting: Orthopaedic Surgery

## 2019-07-13 NOTE — Telephone Encounter (Signed)
agree

## 2019-07-20 ENCOUNTER — Ambulatory Visit (INDEPENDENT_AMBULATORY_CARE_PROVIDER_SITE_OTHER): Payer: BC Managed Care – PPO | Admitting: Orthopaedic Surgery

## 2019-07-20 ENCOUNTER — Encounter: Payer: Self-pay | Admitting: Orthopaedic Surgery

## 2019-07-20 ENCOUNTER — Other Ambulatory Visit: Payer: Self-pay

## 2019-07-20 DIAGNOSIS — M25521 Pain in right elbow: Secondary | ICD-10-CM | POA: Diagnosis not present

## 2019-07-20 NOTE — Progress Notes (Signed)
   Office Visit Note   Patient: Justin Carson           Date of Birth: Jun 16, 1971           MRN: [id number] Visit Date: 07/20/2019              Requested by: Pleas Koch, NP [place],  [place] [address] PCP: Pleas Koch, NP   Assessment & Plan: Visit Diagnoses:  1. Pain in right elbow     Plan: Impression is distal biceps tendinopathy.  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  I offered to put him on light duty but he declined.  He is getting good relief from diclofenac therefore he would like to try relative rest and continue with diclofenac for the next month or so.  If he does not improve significantly then he will likely notify us at which point we would obtain an MRI.  Follow-Up Instructions: Return if symptoms worsen or fail to improve.   Orders:  No orders of the defined types were placed in this encounter.  No orders of the defined types were placed in this encounter.     Procedures: No procedures performed   Clinical Data: No additional findings.   Subjective: Chief Complaint  Patient presents with  . Right Elbow - Pain, Follow-up    Justin Carson returns today for follow-up elbow pain.  The pain is still in the antecubital fossa near the distal biceps tendon.  It is worse with lifting heavy things for work.  He has definitely noticed improvement while on oral diclofenac.  Denies any numbness and tingling.  Denies any posterior elbow pain.  Right elbow exam shows full range of motion without pain.   Review of Systems   Objective: Vital Signs: There were no vitals taken for this visit.  Physical Exam  Ortho Exam He has tenderness in his elbow near the biceps tendon and lacertus.  He does not have significant discomfort with resisted supination.  Specialty Comments:  No specialty comments available.  Imaging: No results found.   PMFS History: Patient Active Problem List   Diagnosis Date Noted  . PAF (paroxysmal atrial fibrillation) ([place]) 01/04/2019  . Palpitations 10/29/2018  . Elevated AST (SGOT) 06/03/2018  . Preventative health care 05/26/2017   Past Medical History:  Diagnosis Date  . Shingles     Family History  Problem Relation Age of Onset  . Lung cancer Mother   . COPD Mother   . Hypertension Father   . Liver disease Father     No past surgical history on file. Social History   Occupational History  . Not on file  Tobacco Use  . Smoking status: Never Smoker  . Smokeless tobacco: Never Used  Substance and Sexual Activity  . Alcohol use: No  . Drug use: No  . Sexual activity: Not on file

## 2019-07-22 ENCOUNTER — Encounter: Payer: Self-pay | Admitting: Internal Medicine

## 2019-08-06 ENCOUNTER — Other Ambulatory Visit: Payer: Self-pay

## 2019-08-06 ENCOUNTER — Ambulatory Visit (AMBULATORY_SURGERY_CENTER): Payer: Self-pay | Admitting: *Deleted

## 2019-08-06 VITALS — Temp 96.8°F | Ht 72.0 in | Wt 212.0 lb

## 2019-08-06 DIAGNOSIS — Z1211 Encounter for screening for malignant neoplasm of colon: Secondary | ICD-10-CM

## 2019-08-06 DIAGNOSIS — Z01818 Encounter for other preprocedural examination: Secondary | ICD-10-CM

## 2019-08-06 NOTE — Progress Notes (Signed)
Patient is here in-person for PV. Patient denies any allergies to eggs or soy. Patient denies any problems with anesthesia/sedation. Patient denies any oxygen use at home. Patient denies taking any diet/weight loss medications or blood thinners. Patient is not being treated for MRSA or C-diff. EMMI education assisgned to the patient for the procedure, this was explained and instructions given to patient. COVID-19 screening test is on 2/9, the pt is aware. Pt is aware that care partner will wait in the car during procedure; if they feel like they will be too hot or cold to wait in the car; they may wait in the 4 th floor lobby. Patient is aware to bring only one care partner. We want them to wear a mask (we do not have any that we can provide them), practice social distancing, and we will check their temperatures when they get here.  I did remind the patient that their care partner needs to stay in the parking lot the entire time and have a cell phone available, we will call them when the pt is ready for discharge. Patient will wear mask into building.

## 2019-08-13 ENCOUNTER — Encounter: Payer: Self-pay | Admitting: Internal Medicine

## 2019-08-17 ENCOUNTER — Other Ambulatory Visit: Payer: Self-pay | Admitting: Internal Medicine

## 2019-08-17 ENCOUNTER — Ambulatory Visit (INDEPENDENT_AMBULATORY_CARE_PROVIDER_SITE_OTHER): Payer: BC Managed Care – PPO

## 2019-08-17 ENCOUNTER — Other Ambulatory Visit: Payer: Self-pay

## 2019-08-17 DIAGNOSIS — Z1159 Encounter for screening for other viral diseases: Secondary | ICD-10-CM

## 2019-08-18 LAB — SARS CORONAVIRUS 2 (TAT 6-24 HRS): SARS Coronavirus 2: NEGATIVE

## 2019-08-20 ENCOUNTER — Other Ambulatory Visit: Payer: Self-pay

## 2019-08-20 ENCOUNTER — Ambulatory Visit (AMBULATORY_SURGERY_CENTER): Payer: BC Managed Care – PPO | Admitting: Internal Medicine

## 2019-08-20 ENCOUNTER — Encounter: Payer: Self-pay | Admitting: Internal Medicine

## 2019-08-20 VITALS — BP 102/66 | HR 57 | Temp 97.3°F | Resp 14 | Ht 72.0 in | Wt 212.0 lb

## 2019-08-20 DIAGNOSIS — D122 Benign neoplasm of ascending colon: Secondary | ICD-10-CM

## 2019-08-20 DIAGNOSIS — Z1211 Encounter for screening for malignant neoplasm of colon: Secondary | ICD-10-CM | POA: Diagnosis present

## 2019-08-20 DIAGNOSIS — D125 Benign neoplasm of sigmoid colon: Secondary | ICD-10-CM

## 2019-08-20 DIAGNOSIS — K635 Polyp of colon: Secondary | ICD-10-CM

## 2019-08-20 MED ORDER — SODIUM CHLORIDE 0.9 % IV SOLN: 500.0000 mL | Freq: Once | INTRAVENOUS | Status: DC

## 2019-08-20 NOTE — Progress Notes (Signed)
Called to room to assist during endoscopic procedure.  Patient ID and intended procedure confirmed with present staff. Received instructions for my participation in the procedure from the performing physician.  

## 2019-08-20 NOTE — Op Note (Signed)
[facility] Patient Name: Justin Carson Procedure Date: 08/20/2019 7:25 AM MRN: [id number] Endoscopist: Gatha Mayer , MD Age: 49 Referring MD:  Date of Birth: January 25, 1971 Gender: Male Account #: [id number] Procedure:                Colonoscopy Indications:              Screening for colorectal malignant neoplasm, This                            is the patient's first colonoscopy Medicines:                Propofol per Anesthesia, Monitored Anesthesia Care Procedure:                Pre-Anesthesia Assessment:                           - Prior to the procedure, a History and Physical                            was performed, and patient medications and                            allergies were reviewed. The patient's tolerance of                            previous anesthesia was also reviewed. The risks                            and benefits of the procedure and the sedation                            options and risks were discussed with the patient.                            All questions were answered, and informed consent                            was obtained. Prior Anticoagulants: The patient has                            taken no previous anticoagulant or antiplatelet                            agents. ASA Grade Assessment: II - A patient with                            mild systemic disease. After reviewing the risks                            and benefits, the patient was deemed in                            satisfactory condition to undergo the procedure.  After obtaining informed consent, the colonoscope                            was passed under direct vision. Throughout the                            procedure, the patient's blood pressure, pulse, and                            oxygen saturations were monitored continuously. The                            Colonoscope was introduced through the anus and   advanced to the the cecum, identified by                            appendiceal orifice and ileocecal valve. The                            colonoscopy was performed without difficulty. The                            patient tolerated the procedure well. The quality                            of the bowel preparation was good. The bowel                            preparation used was Miralax via split dose                            instruction. The ileocecal valve, appendiceal                            orifice, and rectum were photographed. Scope In: 8:10:21 AM Scope Out: 8:31:33 AM Scope Withdrawal Time: 0 hours 17 minutes 26 seconds  Total Procedure Duration: 0 hours 21 minutes 12 seconds  Findings:                 The perianal and digital rectal examinations were                            normal. Pertinent negatives include normal prostate                            (size, shape, and consistency).                           Five sessile polyps were found in the sigmoid colon                            and ascending colon. The polyps were 2 to 8 mm in                            size.  These polyps were removed with a cold snare.                            Resection and retrieval were complete. Verification                            of patient identification for the specimen was                            done. Estimated blood loss was minimal.                           The exam was otherwise without abnormality on                            direct and retroflexion views. Complications:            No immediate complications. Estimated Blood Loss:     Estimated blood loss was minimal. Impression:               - Five 2 to 8 mm polyps in the sigmoid colon and in                            the ascending colon, removed with a cold snare.                            Resected and retrieved.                           - The examination was otherwise normal on direct                             and retroflexion views. Recommendation:           - Patient has a contact number available for                            emergencies. The signs and symptoms of potential                            delayed complications were discussed with the                            patient. Return to normal activities tomorrow.                            Written discharge instructions were provided to the                            patient.                           - Resume previous diet.                           - Continue present medications.                           -  Repeat colonoscopy is recommended. The                            colonoscopy date will be determined after pathology                            results from today's exam become available for                            review. Gatha Mayer, MD 08/20/2019 8:39:25 AM This report has been signed electronically.

## 2019-08-20 NOTE — Patient Instructions (Addendum)
Handout given: polyps Resume previous diet Continue current medications    I found and removed 5 small polyps.  I will let you know pathology results and when to have another routine colonoscopy by mail and/or My Chart.  I appreciate the opportunity to care for you. Gatha Mayer, MD, FACG  YOU HAD AN ENDOSCOPIC PROCEDURE TODAY AT [facility]:   Refer to the procedure report that was given to you for any specific questions about what was found during the examination.  If the procedure report does not answer your questions, please call your gastroenterologist to clarify.  If you requested that your care partner not be given the details of your procedure findings, then the procedure report has been included in a sealed envelope for you to review at your convenience later.  YOU SHOULD EXPECT: Some feelings of bloating in the abdomen. Passage of more gas than usual.  Walking can help get rid of the air that was put into your GI tract during the procedure and reduce the bloating. If you had a lower endoscopy (such as a colonoscopy or flexible sigmoidoscopy) you may notice spotting of blood in your stool or on the toilet paper. If you underwent a bowel prep for your procedure, you may not have a normal bowel movement for a few days.  Please Note:  You might notice some irritation and congestion in your nose or some drainage.  This is from the oxygen used during your procedure.  There is no need for concern and it should clear up in a day or so.  SYMPTOMS TO REPORT IMMEDIATELY:   Following lower endoscopy (colonoscopy or flexible sigmoidoscopy):  Excessive amounts of blood in the stool  Significant tenderness or worsening of abdominal pains  Swelling of the abdomen that is new, acute  Fever of 100F or higher   For urgent or emergent issues, a gastroenterologist can be reached at any hour by calling [phone].   DIET:  We do recommend a small meal at first, but then  you may proceed to your regular diet.  Drink plenty of fluids but you should avoid alcoholic beverages for 24 hours.  ACTIVITY:  You should plan to take it easy for the rest of today and you should NOT DRIVE or use heavy machinery until tomorrow (because of the sedation medicines used during the test).    FOLLOW UP: Our staff will call the number listed on your records 48-72 hours following your procedure to check on you and address any questions or concerns that you may have regarding the information given to you following your procedure. If we do not reach you, we will leave a message.  We will attempt to reach you two times.  During this call, we will ask if you have developed any symptoms of COVID 19. If you develop any symptoms (ie: fever, flu-like symptoms, shortness of breath, cough etc.) before then, please call [phone].  If you test positive for Covid 19 in the 2 weeks post procedure, please call and report this information to [place].    If any biopsies were taken you will be contacted by phone or by letter within the next 1-3 weeks.  Please call us at [phone] if you have not heard about the biopsies in 3 weeks.    SIGNATURES/CONFIDENTIALITY: You and/or your care partner have signed paperwork which will be entered into your electronic medical record.  These signatures attest to the fact that that the information above  on your After Visit Summary has been reviewed and is understood.  Full responsibility of the confidentiality of this discharge information lies with you and/or your care-partner.YOU HAD AN ENDOSCOPIC PROCEDURE TODAY AT [facility]:   Refer to the procedure report that was given to you for any specific questions about what was found during the examination.  If the procedure report does not answer your questions, please call your gastroenterologist to clarify.  If you requested that your care partner not be given the details of your procedure findings, then  the procedure report has been included in a sealed envelope for you to review at your convenience later.  YOU SHOULD EXPECT: Some feelings of bloating in the abdomen. Passage of more gas than usual.  Walking can help get rid of the air that was put into your GI tract during the procedure and reduce the bloating. If you had a lower endoscopy (such as a colonoscopy or flexible sigmoidoscopy) you may notice spotting of blood in your stool or on the toilet paper. If you underwent a bowel prep for your procedure, you may not have a normal bowel movement for a few days.  Please Note:  You might notice some irritation and congestion in your nose or some drainage.  This is from the oxygen used during your procedure.  There is no need for concern and it should clear up in a day or so.  SYMPTOMS TO REPORT IMMEDIATELY:   Following lower endoscopy (colonoscopy or flexible sigmoidoscopy):  Excessive amounts of blood in the stool  Significant tenderness or worsening of abdominal pains  Swelling of the abdomen that is new, acute  Fever of 100F or higher     For urgent or emergent issues, a gastroenterologist can be reached at any hour by calling [phone].   DIET:  We do recommend a small meal at first, but then you may proceed to your regular diet.  Drink plenty of fluids but you should avoid alcoholic beverages for 24 hours.  ACTIVITY:  You should plan to take it easy for the rest of today and you should NOT DRIVE or use heavy machinery until tomorrow (because of the sedation medicines used during the test).    FOLLOW UP: Our staff will call the number listed on your records 48-72 hours following your procedure to check on you and address any questions or concerns that you may have regarding the information given to you following your procedure. If we do not reach you, we will leave a message.  We will attempt to reach you two times.  During this call, we will ask if you have developed any symptoms  of COVID 19. If you develop any symptoms (ie: fever, flu-like symptoms, shortness of breath, cough etc.) before then, please call [phone].  If you test positive for Covid 19 in the 2 weeks post procedure, please call and report this information to [place].    If any biopsies were taken you will be contacted by phone or by letter within the next 1-3 weeks.  Please call us at [phone] if you have not heard about the biopsies in 3 weeks.    SIGNATURES/CONFIDENTIALITY: You and/or your care partner have signed paperwork which will be entered into your electronic medical record.  These signatures attest to the fact that that the information above on your After Visit Summary has been reviewed and is understood.  Full responsibility of the confidentiality of this discharge information lies with you and/or  your care-partner. 

## 2019-08-20 NOTE — Progress Notes (Signed)
Temp JB VS DT  Pt's states no medical or surgical changes since previsit or office visit. 

## 2019-08-20 NOTE — Progress Notes (Signed)
Report given to PACU, vss 

## 2019-08-24 ENCOUNTER — Telehealth: Payer: Self-pay

## 2019-08-24 NOTE — Telephone Encounter (Signed)
  Follow up Call-  Call back number 08/20/2019  Post procedure Call Back phone  # [phone]  Permission to leave phone message Yes  Some recent data might be hidden     Patient questions:  Do you have a fever, pain , or abdominal swelling? No. Pain Score  0 *  Have you tolerated food without any problems? Yes.    Have you been able to return to your normal activities? Yes.    Do you have any questions about your discharge instructions: Diet   No. Medications  No. Follow up visit  No.  Do you have questions or concerns about your Care? No.  Actions: * If pain score is 4 or above: No action needed, pain <4.  1. Have you developed a fever since your procedure? no  2.   Have you had an respiratory symptoms (SOB or cough) since your procedure? no  3.   Have you tested positive for COVID 19 since your procedure no  4.   Have you had any family members/close contacts diagnosed with the COVID 19 since your procedure? no   If yes to any of these questions please route to Joylene John, RN and Alphonsa Gin, Therapist, sports.

## 2019-08-26 ENCOUNTER — Encounter: Payer: Self-pay | Admitting: Internal Medicine

## 2019-08-26 DIAGNOSIS — Z86010 Personal history of colonic polyps: Secondary | ICD-10-CM

## 2019-08-26 HISTORY — DX: Personal history of colonic polyps: Z86.010

## 2019-09-03 ENCOUNTER — Telehealth: Payer: Self-pay | Admitting: *Deleted

## 2019-09-03 NOTE — Telephone Encounter (Signed)
Called left message for patient to call back if he require a in office visit at present time the 09/09/19  Appointment will be virtual instead of in office .

## 2019-09-09 ENCOUNTER — Telehealth: Payer: Self-pay | Admitting: *Deleted

## 2019-09-09 ENCOUNTER — Encounter: Payer: Self-pay | Admitting: Cardiology

## 2019-09-09 ENCOUNTER — Telehealth (INDEPENDENT_AMBULATORY_CARE_PROVIDER_SITE_OTHER): Payer: BC Managed Care – PPO | Admitting: Cardiology

## 2019-09-09 DIAGNOSIS — R002 Palpitations: Secondary | ICD-10-CM

## 2019-09-09 DIAGNOSIS — Z7189 Other specified counseling: Secondary | ICD-10-CM | POA: Diagnosis not present

## 2019-09-09 DIAGNOSIS — I480 Paroxysmal atrial fibrillation: Secondary | ICD-10-CM | POA: Diagnosis not present

## 2019-09-09 NOTE — Telephone Encounter (Signed)
RN spoke to patient. Instruction were given  from today's virtual visit 09/09/19 .  AVS SUMMARY has been sent by mychart .   Patient verbalized understanding

## 2019-09-09 NOTE — Progress Notes (Signed)
Virtual Visit via Telephone Note   This visit type was conducted due to national recommendations for restrictions regarding the COVID-19 Pandemic (e.g. social distancing) in an effort to limit this patient's exposure and mitigate transmission in our community.  Due to his co-morbid illnesses, this patient is at least at moderate risk for complications without adequate follow up.  This format is felt to be most appropriate for this patient at this time.  The patient did not have access to video technology/had technical difficulties with video requiring transitioning to audio format only (telephone).  All issues noted in this document were discussed and addressed.  No physical exam could be performed with this format.  Please refer to the patient's chart for his  consent to telehealth for [facility].   Patient has given verbal permission to conduct this visit via virtual appointment and to bill insurance 09/12/2019 11:01 PM     Evaluation Performed:  Follow-up visit  Date:  09/12/2019   ID:  Justin Carson, DOB 19-Aug-1970, MRN [id number]  Patient Location: Home Provider Location: Home  PCP:  Pleas Koch, NP  Cardiologist:  Glenetta Hew, MD  Electrophysiologist:  None   Chief Complaint:   Chief Complaint  Patient presents with  . Follow-up    Test results  . Atrial Fibrillation    Minimal episodes since cutting back caffeine    History of Present Illness:    Justin Carson is a 49 y.o. male with PMH notable for PAF (CHA2DS2Vasc = 0)  who presents via Engineer, civil (consulting) for a telehealth visit today as 9 month f/yu.  Justin Carson was last seen on December 26, 2018 in follow-up for an event monitor showing mostly sinus rhythm but with prolonged episodes of atrial bigeminy as well as A. fib.  A. fib burden roughly 4%.  He noted that his palpitations have actually improved significantly.  He cut out caffeine and sweets.  He noted less frequent episodes and less severe. -  CHA2DS2VAsc = 0 so no anticoagulation, baby aspirin. -Ordered GXT and echo -[> coronary CTA ordered because of questionable EKG changes on GXT  Hospitalizations:  . n/a   Recent - Interim CV studies:   The following studies were reviewed today: . GXT 02/12/2019:Exercised for 13:26 min; 3 mm in V1 ST elevation noted at 10 minutes of stress. . TTE 01/15/2019: Normal LV size and function.  EF 55 to 60%.  Normal valves. . Coronary CTA 03/19/2019: Coronary calcium score 0.  No significant CAD.  Small secundum ASD noted.  Inerval History   Justin Carson follows up today via telemedicine stating he is doing very well.  He pretty much is figured out that his palpitation episodes were triggered by stress and caffeine.  He is notably cut down his caffeine, and in doing so has minimized some of the episodes he is having.  Nothing prolonged.  Long the symptoms last less than a few minutes.  No associated lightheadedness, dizziness or syncope/near syncope.  Cardiovascular ROS: no chest pain or dyspnea on exertion positive for - Well-controlled palpitations.  See above negative for - edema, orthopnea, paroxysmal nocturnal dyspnea, shortness of breath or TIA/amaurosis fugax, claudication   ROS:  Please see the history of present illness.    The patient does not have symptoms concerning for COVID-19 infection (fever, chills, cough, or new shortness of breath).  Review of Systems  HENT: Negative for nosebleeds.   Gastrointestinal: Negative for blood in stool and melena.  Genitourinary: Negative for hematuria.  Neurological: Negative for dizziness.    The patient is practicing social distancing. 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  Past Medical History:  Diagnosis Date  . Hx of adenomatous colonic polyps 08/26/2019  . PAF (paroxysmal atrial fibrillation) ([place])    CHA2DS2Vasc = 0  . Shingles    Past Surgical History:  Procedure Laterality Date  .  CORONARY CT ANGIOGRAM  03/2019   Coronary calcium score 0.  No significant CAD.  Small secundum  . EXERCISE TOLERANCE TEST  02/2019   Exercised for 13:26 min; 3 mm in V1 ST elevation noted at 10 minutes of stress.->  Referred for coronary CTA, no CAD noted.  Coronary calcium score 0  . foot wound closure     at age 62  . TRANSTHORACIC ECHOCARDIOGRAM  01/2019   Normal LV size and function.  EF 55 to 60%.  Normal valves.  . WISDOM TOOTH EXTRACTION       Current Meds  Medication Sig  . acetaminophen (TYLENOL) 500 MG tablet Take 1,000 mg by mouth every 6 (six) hours as needed for moderate pain.  Marland Kitchen aspirin EC 81 MG tablet Take 1 tablet (81 mg total) by mouth daily.  . diclofenac (VOLTAREN) 75 MG EC tablet Take 75 mg by mouth as needed.  . Polyethylene Glycol 3350 (MIRALAX PO) Take by mouth as directed.     Allergies:   Patient has no known allergies.   Social History   Tobacco Use  . Smoking status: Never Smoker  . Smokeless tobacco: Never Used  Substance Use Topics  . Alcohol use: No  . Drug use: No     Family Hx: The patient's family history includes COPD in his mother; Hypertension in his father; Liver disease in his father; Lung cancer in his mother; Throat cancer in his mother. There is no history of Colon cancer, Colon polyps, Esophageal cancer, Stomach cancer, or Rectal cancer.   Labs/Other Tests and Data Reviewed:    EKG:  No ECG reviewed.  Recent Labs: 10/29/2018: Hemoglobin 13.5; Platelets 178.0; TSH 0.97 07/08/2019: ALT 47; BUN 15; Creatinine, Ser 1.22; Potassium 4.8; Sodium 140   Recent Lipid Panel Lab Results  Component Value Date/Time   CHOL 196 07/08/2019 09:51 AM   TRIG 67.0 07/08/2019 09:51 AM   HDL 44.70 07/08/2019 09:51 AM   CHOLHDL 4 07/08/2019 09:51 AM   LDLCALC 138 (H) 07/08/2019 09:51 AM    Wt Readings from Last 3 Encounters:  09/09/19 205 lb (93 kg)  08/20/19 212 lb (96.2 kg)  08/06/19 212 lb (96.2 kg)     Objective:    Vital Signs:  Ht 6'  1" (1.854 m)   Wt 205 lb (93 kg)   BMI 27.05 kg/m   VITAL SIGNS:  reviewed GEN:  no acute distress A&O x 3.  Normal Mood & Affect Non-labored respirations   ASSESSMENT & PLAN:    Problem List Items Addressed This Visit    Palpitations (Chronic)    Monitor showed atrial bigeminy in A. fib.  All better controlled since reducing caffeine.  For now we will hold off on any medical judgment, but could consider beta-blocker in the future.      PAF (paroxysmal atrial fibrillation) (HCC) (Chronic)    Minimal A. fib burden on his monitor in the past, but also noted atrial bigeminy.  Symptoms seem to have significantly reduced since reducing his caffeine level.  Nonischemic work-up.  Normal echo. -  CHA2DS2VAsc = 0 so no anticoagulation, baby aspirin.         COVID-19 Education: The signs and symptoms of COVID-19 were discussed with the patient and how to seek care for testing (follow up with PCP or arrange E-visit).   The importance of social distancing was discussed today.  Time:   Today, I have spent 15 minutes with the patient with telehealth technology discussing the above problems.  Test results reviewed for 3 tests.  4-5 minutes were charting.  Total time 20 minutes   Medication Adjustments/Labs and Tests Ordered: Current medicines are reviewed at length with the patient today.  Concerns regarding medicines are outlined above.   Patient Instructions  Medication Instructions:  No changes  *If you need a refill on your cardiac medications before your next appointment, please call your pharmacy*   Lab Work: n/a    Testing/Procedures: n/a   Follow-Up: At [facility], you and your health needs are our priority.  As part of our continuing mission to provide you with exceptional heart care, we have created designated Provider Care Teams.  These Care Teams include your primary Cardiologist (physician) and Advanced Practice Providers (APPs -  Physician Assistants and  Nurse Practitioners) who all work together to provide you with the care you need, when you need it.     Your next appointment:   1 year(s)  The format for your next appointment:   In Person  Provider:   Glenetta Hew, MD   Other Instructions  n/a     Signed, Glenetta Hew, MD  09/12/2019 11:01 PM    [place]

## 2019-09-09 NOTE — Telephone Encounter (Signed)
Patient had appointment  09/09/19

## 2019-09-09 NOTE — Patient Instructions (Addendum)
Medication Instructions:  No changes  *If you need a refill on your cardiac medications before your next appointment, please call your pharmacy*   Lab Work: n/a    Testing/Procedures: n/a   Follow-Up: At [facility], you and your health needs are our priority.  As part of our continuing mission to provide you with exceptional heart care, we have created designated Provider Care Teams.  These Care Teams include your primary Cardiologist (physician) and Advanced Practice Providers (APPs -  Physician Assistants and Nurse Practitioners) who all work together to provide you with the care you need, when you need it.     Your next appointment:   1 year(s)  The format for your next appointment:   In Person  Provider:   Glenetta Hew, MD   Other Instructions  n/a

## 2019-09-12 ENCOUNTER — Encounter: Payer: Self-pay | Admitting: Cardiology

## 2019-09-12 NOTE — Assessment & Plan Note (Signed)
Minimal A. fib burden on his monitor in the past, but also noted atrial bigeminy.  Symptoms seem to have significantly reduced since reducing his caffeine level.  Nonischemic work-up.  Normal echo. - CHA2DS2VAsc = 0 so no anticoagulation, baby aspirin.

## 2019-09-12 NOTE — Assessment & Plan Note (Signed)
Monitor showed atrial bigeminy in A. fib.  All better controlled since reducing caffeine.  For now we will hold off on any medical judgment, but could consider beta-blocker in the future.

## 2020-06-05 ENCOUNTER — Other Ambulatory Visit: Payer: Self-pay | Admitting: Otolaryngology

## 2020-06-13 ENCOUNTER — Other Ambulatory Visit: Payer: Self-pay

## 2020-06-13 ENCOUNTER — Telehealth: Payer: Self-pay | Admitting: *Deleted

## 2020-06-13 ENCOUNTER — Encounter (HOSPITAL_BASED_OUTPATIENT_CLINIC_OR_DEPARTMENT_OTHER): Payer: Self-pay | Admitting: Otolaryngology

## 2020-06-13 NOTE — Progress Notes (Signed)
chart reviewed by Dr Elgie Congo. Patient will need an EKG prior to surgery.

## 2020-06-13 NOTE — Telephone Encounter (Signed)
   [facility] Pre-operative Risk Assessment    HEARTCARE STAFF: - Please ensure there is not already an duplicate clearance open for this procedure. - Under Visit Info/Reason for Call, type in Other and utilize the format Clearance MM/DD/YY or Clearance TBD. Do not use dashes or single digits. - If request is for dental extraction, please clarify the # of teeth to be extracted.  Request for surgical clearance:  1. What type of surgery is being performed? Septoplasty, bilateral turbinate reduction   2. When is this surgery scheduled? 06/19/20   3. What type of clearance is required (medical clearance vs. Pharmacy clearance to hold med vs. Both)? both  4. Are there any medications that need to be held prior to surgery and how long?aspirin-they are asking for direction   5. Practice name and name of physician performing surgery? Dr Benjamine Mola   6. What is the office phone number? [phone]   7.   What is the office fax number? [phone]  8.   Anesthesia type (None, local, MAC, general) ? general   Fredia Beets 06/13/2020, 2:24 PM  _________________________________________________________________   (provider comments below)

## 2020-06-13 NOTE — Telephone Encounter (Signed)
   Primary Cardiologist: Glenetta Hew, MD  Chart reviewed and patient contacted by phone today as part of pre-operative protocol coverage. Given past medical history and time since last visit, based on ACC/AHA guidelines, Justin Carson would be at acceptable risk for the planned procedure without further cardiovascular testing.   OK to hold aspirin 5 days pre op if needed.  The patient was advised that if he develops new symptoms prior to surgery to contact our office to arrange for a follow-up visit, and he verbalized understanding.  I will route this recommendation to the requesting party via Epic fax function and remove from pre-op pool.  Please call with questions.  Kerin Ransom, PA-C 06/13/2020, 3:55 PM

## 2020-06-15 ENCOUNTER — Other Ambulatory Visit (HOSPITAL_COMMUNITY)
Admission: RE | Admit: 2020-06-15 | Discharge: 2020-06-15 | Disposition: A | Discharge status: Home / Self Care | Payer: BC Managed Care – PPO | Source: Ambulatory Visit | Attending: Otolaryngology | Admitting: Otolaryngology

## 2020-06-15 ENCOUNTER — Encounter (HOSPITAL_BASED_OUTPATIENT_CLINIC_OR_DEPARTMENT_OTHER)
Admission: RE | Admit: 2020-06-15 | Discharge: 2020-06-15 | Disposition: A | Discharge status: Home / Self Care | Payer: BC Managed Care – PPO | Source: Ambulatory Visit | Attending: Otolaryngology | Admitting: Otolaryngology

## 2020-06-15 DIAGNOSIS — Z01812 Encounter for preprocedural laboratory examination: Secondary | ICD-10-CM | POA: Diagnosis not present

## 2020-06-15 DIAGNOSIS — Z20822 Contact with and (suspected) exposure to covid-19: Secondary | ICD-10-CM | POA: Insufficient documentation

## 2020-06-15 DIAGNOSIS — Z01810 Encounter for preprocedural cardiovascular examination: Secondary | ICD-10-CM | POA: Diagnosis present

## 2020-06-15 DIAGNOSIS — I4891 Unspecified atrial fibrillation: Secondary | ICD-10-CM | POA: Insufficient documentation

## 2020-06-15 LAB — SARS CORONAVIRUS 2 (TAT 6-24 HRS): SARS Coronavirus 2: NEGATIVE

## 2020-06-19 ENCOUNTER — Ambulatory Visit (HOSPITAL_BASED_OUTPATIENT_CLINIC_OR_DEPARTMENT_OTHER)
Admission: RE | Admit: 2020-06-19 | Discharge: 2020-06-19 | Disposition: A | Discharge status: Home / Self Care | Payer: BC Managed Care – PPO | Attending: Otolaryngology | Admitting: Otolaryngology

## 2020-06-19 ENCOUNTER — Encounter (HOSPITAL_BASED_OUTPATIENT_CLINIC_OR_DEPARTMENT_OTHER)
Admission: RE | Disposition: A | Discharge status: Home / Self Care | Payer: Self-pay | Source: Home / Self Care | Attending: Otolaryngology

## 2020-06-19 ENCOUNTER — Ambulatory Visit (HOSPITAL_BASED_OUTPATIENT_CLINIC_OR_DEPARTMENT_OTHER): Payer: BC Managed Care – PPO | Admitting: Anesthesiology

## 2020-06-19 ENCOUNTER — Encounter (HOSPITAL_BASED_OUTPATIENT_CLINIC_OR_DEPARTMENT_OTHER): Payer: Self-pay | Admitting: Otolaryngology

## 2020-06-19 ENCOUNTER — Other Ambulatory Visit: Payer: Self-pay

## 2020-06-19 DIAGNOSIS — J310 Chronic rhinitis: Secondary | ICD-10-CM | POA: Diagnosis not present

## 2020-06-19 DIAGNOSIS — J343 Hypertrophy of nasal turbinates: Secondary | ICD-10-CM | POA: Insufficient documentation

## 2020-06-19 DIAGNOSIS — J342 Deviated nasal septum: Secondary | ICD-10-CM | POA: Diagnosis present

## 2020-06-19 HISTORY — DX: Other complications of anesthesia, initial encounter: T88.59XA

## 2020-06-19 HISTORY — DX: Cardiac arrhythmia, unspecified: I49.9

## 2020-06-19 HISTORY — PX: NASAL SEPTOPLASTY W/ TURBINOPLASTY: SHX2070

## 2020-06-19 SURGERY — SEPTOPLASTY, NOSE, WITH NASAL TURBINATE REDUCTION
Anesthesia: General | Site: Nose | Laterality: Bilateral

## 2020-06-19 MED ORDER — LIDOCAINE-EPINEPHRINE 1 %-1:100000 IJ SOLN
INTRAMUSCULAR | Status: DC | PRN
  Administered 2020-06-19: 4 mL

## 2020-06-19 MED ORDER — ROCURONIUM BROMIDE 10 MG/ML (PF) SYRINGE
PREFILLED_SYRINGE | INTRAVENOUS | Status: AC
  Filled 2020-06-19: qty 10

## 2020-06-19 MED ORDER — DEXAMETHASONE SODIUM PHOSPHATE 10 MG/ML IJ SOLN
INTRAMUSCULAR | Status: AC
  Filled 2020-06-19: qty 1

## 2020-06-19 MED ORDER — LIDOCAINE 2% (20 MG/ML) 5 ML SYRINGE
INTRAMUSCULAR | Status: AC
  Filled 2020-06-19: qty 5

## 2020-06-19 MED ORDER — OXYCODONE-ACETAMINOPHEN 5-325 MG PO TABS
1.0000 | ORAL_TABLET | ORAL | 0 refills | Status: AC | PRN
Start: 2020-06-19 — End: 2020-06-22

## 2020-06-19 MED ORDER — CEFAZOLIN SODIUM-DEXTROSE 2-3 GM-%(50ML) IV SOLR
INTRAVENOUS | Status: DC | PRN
  Administered 2020-06-19: 2 g via INTRAVENOUS

## 2020-06-19 MED ORDER — ONDANSETRON HCL 4 MG/2ML IJ SOLN
INTRAMUSCULAR | Status: AC
  Filled 2020-06-19: qty 2

## 2020-06-19 MED ORDER — LACTATED RINGERS IV SOLN: INTRAVENOUS | Status: DC

## 2020-06-19 MED ORDER — MIDAZOLAM HCL 5 MG/5ML IJ SOLN
INTRAMUSCULAR | Status: DC | PRN
  Administered 2020-06-19: 2 mg via INTRAVENOUS

## 2020-06-19 MED ORDER — MUPIROCIN 2 % EX OINT
TOPICAL_OINTMENT | CUTANEOUS | Status: DC | PRN
  Administered 2020-06-19: 1 via NASAL

## 2020-06-19 MED ORDER — ONDANSETRON HCL 4 MG/2ML IJ SOLN
INTRAMUSCULAR | Status: DC | PRN
  Administered 2020-06-19: 4 mg via INTRAVENOUS

## 2020-06-19 MED ORDER — PROPOFOL 10 MG/ML IV BOLUS
INTRAVENOUS | Status: AC
  Filled 2020-06-19: qty 20

## 2020-06-19 MED ORDER — ONDANSETRON 4 MG PO TBDP
4.0000 mg | ORAL_TABLET | Freq: Once | ORAL | Status: AC
  Administered 2020-06-19: 4 mg via ORAL

## 2020-06-19 MED ORDER — LIDOCAINE 2% (20 MG/ML) 5 ML SYRINGE
INTRAMUSCULAR | Status: DC | PRN
  Administered 2020-06-19: 100 mg via INTRAVENOUS

## 2020-06-19 MED ORDER — FENTANYL CITRATE (PF) 100 MCG/2ML IJ SOLN
INTRAMUSCULAR | Status: AC
  Filled 2020-06-19: qty 2

## 2020-06-19 MED ORDER — OXYMETAZOLINE HCL 0.05 % NA SOLN
NASAL | Status: DC | PRN
  Administered 2020-06-19: 1 via TOPICAL

## 2020-06-19 MED ORDER — DEXAMETHASONE SODIUM PHOSPHATE 10 MG/ML IJ SOLN
INTRAMUSCULAR | Status: DC | PRN
  Administered 2020-06-19: 10 mg via INTRAVENOUS

## 2020-06-19 MED ORDER — SUGAMMADEX SODIUM 500 MG/5ML IV SOLN
INTRAVENOUS | Status: AC
  Filled 2020-06-19: qty 5

## 2020-06-19 MED ORDER — CEFAZOLIN SODIUM 1 G IJ SOLR
INTRAMUSCULAR | Status: AC
  Filled 2020-06-19: qty 20

## 2020-06-19 MED ORDER — ROCURONIUM BROMIDE 100 MG/10ML IV SOLN
INTRAVENOUS | Status: DC | PRN
  Administered 2020-06-19: 100 mg via INTRAVENOUS

## 2020-06-19 MED ORDER — PROPOFOL 500 MG/50ML IV EMUL
INTRAVENOUS | Status: AC
  Filled 2020-06-19: qty 50

## 2020-06-19 MED ORDER — ONDANSETRON 4 MG PO TBDP
ORAL_TABLET | ORAL | Status: AC
  Filled 2020-06-19: qty 1

## 2020-06-19 MED ORDER — FENTANYL CITRATE (PF) 100 MCG/2ML IJ SOLN
INTRAMUSCULAR | Status: DC | PRN
  Administered 2020-06-19: 100 ug via INTRAVENOUS
  Administered 2020-06-19: 50 ug via INTRAVENOUS

## 2020-06-19 MED ORDER — SUGAMMADEX SODIUM 200 MG/2ML IV SOLN
INTRAVENOUS | Status: DC | PRN
  Administered 2020-06-19: 200 mg via INTRAVENOUS

## 2020-06-19 MED ORDER — AMOXICILLIN 875 MG PO TABS: 875.0000 mg | ORAL_TABLET | Freq: Two times a day (BID) | ORAL | 0 refills | Status: AC

## 2020-06-19 MED ORDER — PROPOFOL 10 MG/ML IV BOLUS
INTRAVENOUS | Status: DC | PRN
  Administered 2020-06-19: 160 mg via INTRAVENOUS

## 2020-06-19 MED ORDER — MIDAZOLAM HCL 2 MG/2ML IJ SOLN
INTRAMUSCULAR | Status: AC
  Filled 2020-06-19: qty 2

## 2020-06-19 SURGICAL SUPPLY — 35 items
ATTRACTOMAT 16X20 MAGNETIC DRP (DRAPES) IMPLANT
CANISTER SUCT 1200ML W/VALVE (MISCELLANEOUS) ×3 IMPLANT
COAGULATOR SUCT 8FR VV (MISCELLANEOUS) ×3 IMPLANT
COVER WAND RF STERILE (DRAPES) IMPLANT
DECANTER SPIKE VIAL GLASS SM (MISCELLANEOUS) ×2 IMPLANT
DRSG NASOPORE 8CM (GAUZE/BANDAGES/DRESSINGS) IMPLANT
DRSG TELFA 3X8 NADH (GAUZE/BANDAGES/DRESSINGS) IMPLANT
ELECT REM PT RETURN 9FT ADLT (ELECTROSURGICAL) ×3
ELECTRODE REM PT RTRN 9FT ADLT (ELECTROSURGICAL) ×1 IMPLANT
GLOVE BIO SURGEON STRL SZ 6.5 (GLOVE) ×1 IMPLANT
GLOVE BIO SURGEON STRL SZ7.5 (GLOVE) ×3 IMPLANT
GLOVE BIO SURGEONS STRL SZ 6.5 (GLOVE) ×1
GOWN STRL REUS W/ TWL LRG LVL3 (GOWN DISPOSABLE) ×2 IMPLANT
GOWN STRL REUS W/TWL LRG LVL3 (GOWN DISPOSABLE) ×6
NDL HYPO 25X1 1.5 SAFETY (NEEDLE) ×1 IMPLANT
NEEDLE HYPO 25X1 1.5 SAFETY (NEEDLE) ×3 IMPLANT
NS IRRIG 1000ML POUR BTL (IV SOLUTION) ×3 IMPLANT
PACK BASIN DAY SURGERY FS (CUSTOM PROCEDURE TRAY) ×3 IMPLANT
PACK ENT DAY SURGERY (CUSTOM PROCEDURE TRAY) ×3 IMPLANT
PAD DRESSING TELFA 3X8 NADH (GAUZE/BANDAGES/DRESSINGS) IMPLANT
SLEEVE SCD COMPRESS KNEE MED (MISCELLANEOUS) ×2 IMPLANT
SOLUTION BUTLER CLEAR DIP (MISCELLANEOUS) ×3 IMPLANT
SPLINT NASAL AIRWAY SILICONE (MISCELLANEOUS) ×3 IMPLANT
SPONGE GAUZE 2X2 8PLY STER LF (GAUZE/BANDAGES/DRESSINGS) ×1
SPONGE GAUZE 2X2 8PLY STRL LF (GAUZE/BANDAGES/DRESSINGS) ×2 IMPLANT
SPONGE NEURO XRAY DETECT 1X3 (DISPOSABLE) ×3 IMPLANT
SUT CHROMIC 4 0 P 3 18 (SUTURE) ×3 IMPLANT
SUT PLAIN 4 0 ~~LOC~~ 1 (SUTURE) ×3 IMPLANT
SUT PROLENE 3 0 PS 2 (SUTURE) ×3 IMPLANT
SUT VIC AB 4-0 P-3 18XBRD (SUTURE) IMPLANT
SUT VIC AB 4-0 P3 18 (SUTURE)
TOWEL GREEN STERILE FF (TOWEL DISPOSABLE) ×3 IMPLANT
TUBE SALEM SUMP 12R W/ARV (TUBING) IMPLANT
TUBE SALEM SUMP 16 FR W/ARV (TUBING) ×3 IMPLANT
YANKAUER SUCT BULB TIP NO VENT (SUCTIONS) ×3 IMPLANT

## 2020-06-19 NOTE — Op Note (Signed)
DATE OF PROCEDURE: 06/19/2020  OPERATIVE REPORT   SURGEON: Leta Baptist, MD   PREOPERATIVE DIAGNOSES:  1. Severe nasal septal deviation.  2. Bilateral inferior turbinate hypertrophy.  3. Chronic nasal obstruction.  POSTOPERATIVE DIAGNOSES:  1. Severe nasal septal deviation.  2. Bilateral inferior turbinate hypertrophy.  3. Chronic nasal obstruction.  PROCEDURE PERFORMED:  1. Septoplasty.  2. Bilateral partial inferior turbinate resection.   ANESTHESIA: General endotracheal tube anesthesia.   COMPLICATIONS: None.   ESTIMATED BLOOD LOSS: 50 mL.   INDICATION FOR PROCEDURE: Justin Carson is a 49 y.o. male with a history of chronic nasal obstruction. The patient was treated with antihistamine, decongestant, and steroid nasal sprays. However, the patient continued to be symptomatic. On examination, the patient was noted to have bilateral severe inferior turbinate hypertrophy and significant nasal septal deviation, causing significant nasal obstruction. Based on the above findings, the decision was made for the patient to undergo the above-stated procedures. The risks, benefits, alternatives, and details of the procedures were discussed with the patient. Questions were invited and answered. Informed consent was obtained.   DESCRIPTION OF PROCEDURE: The patient was taken to the operating room and placed supine on the operating table. General endotracheal tube anesthesia was administered by the anesthesiologist. The patient was positioned, and prepped and draped in the standard fashion for nasal surgery. Pledgets soaked with Afrin were placed in both nasal cavities for decongestion. The pledgets were subsequently removed.   Examination of the nasal cavity revealed a severe nasal septal deviation. 1% lidocaine with 1:100,000 epinephrine was injected onto the nasal septum bilaterally. A hemitransfixion incision was made on the left side. The mucosal flap was carefully elevated on the left side. A  cartilaginous incision was made 1 cm superior to the caudal margin of the nasal septum. Mucosal flap was also elevated on the right side in the similar fashion. It should be noted that due to the severe septal deviation, the deviated portion of the cartilaginous and bony septum had to be removed in piecemeal fashion. Once the deviated portions were removed, a straight midline septum was achieved. The septum was then quilted with 4-0 plain gut sutures. The hemitransfixion incision was closed with interrupted 4-0 chromic sutures.   The inferior one half of both hypertrophied inferior turbinate was crossclamped with a Kelly clamp. The inferior one half of each inferior turbinate was then resected with a pair of cross cutting scissors. Hemostasis was achieved with a suction cautery device. Doyle splints were applied to the nasal septum.  The care of the patient was turned over to the anesthesiologist. The patient was awakened from anesthesia without difficulty. The patient was extubated and transferred to the recovery room in good condition.   OPERATIVE FINDINGS: Severe nasal septal deviation and bilateral inferior turbinate hypertrophy.   SPECIMEN: None.   FOLLOWUP CARE: The patient be discharged home once he is awake and alert. The patient will be placed on Percocet p.r.n. pain, and amoxicillin 875 mg for 3 days. The patient will follow up in my office in 3 days for splint removal.   [name], MD

## 2020-06-19 NOTE — Anesthesia Procedure Notes (Signed)
Procedure Name: Intubation Date/Time: 06/19/2020 9:08 AM Performed by: Lavonia Dana, CRNA Pre-anesthesia Checklist: Patient identified, Emergency Drugs available, Suction available and Patient being monitored Patient Re-evaluated:Patient Re-evaluated prior to induction Oxygen Delivery Method: Circle system utilized Preoxygenation: Pre-oxygenation with 100% oxygen Induction Type: IV induction Ventilation: Mask ventilation without difficulty Laryngoscope Size: Mac and 4 Grade View: Grade II Tube type: Oral Tube size: 7.5 mm Number of attempts: 1 Airway Equipment and Method: Stylet and Oral airway Placement Confirmation: ETT inserted through vocal cords under direct vision,  positive ETCO2 and breath sounds checked- equal and bilateral Secured at: 24 cm Tube secured with: Tape Dental Injury: Teeth and Oropharynx as per pre-operative assessment

## 2020-06-19 NOTE — Anesthesia Postprocedure Evaluation (Signed)
Anesthesia Post Note  Patient: Justin Carson  Procedure(s) Performed: NASAL SEPTOPLASTY WITH TURBINATE REDUCTION (Bilateral Nose)     Patient location during evaluation: PACU Anesthesia Type: General Level of consciousness: awake and alert Pain management: pain level controlled Vital Signs Assessment: post-procedure vital signs reviewed and stable Respiratory status: spontaneous breathing, nonlabored ventilation, respiratory function stable and patient connected to nasal cannula oxygen Cardiovascular status: blood pressure returned to baseline and stable Postop Assessment: no apparent nausea or vomiting Anesthetic complications: no   No complications documented.  Last Vitals:  Vitals:   06/19/20 1032 06/19/20 1053  BP: 131/68 (!) 147/97  Pulse: 61 (!) 56  Resp: 15 17  Temp:  36.5 C  SpO2: 99% 100%    Last Pain:  Vitals:   06/19/20 1038  TempSrc:   PainSc: 0-No pain                 Tyreke Kaeser DAVID

## 2020-06-19 NOTE — H&P (Signed)
Cc: Chronic nasal obstruction  HPI: The patient is a 49 y/o male who presents today for evaluation of chronic nasal congestion. The patient has noted difficulty breathing through his nose for several years. He has been told in the past that his septum is deviated. The patient is a habitual mouth breather with loud snoring noted at night. He has not been told of any witnessed apnea episodes. The patient has used steroid nasal sprays short term in the past with minimal relief. He currently denies facial pain or pressure. Previous ENT surgery is denied.   The patient's review of systems (constitutional, eyes, ENT, cardiovascular, respiratory, GI, musculoskeletal, skin, neurologic, psychiatric, endocrine, hematologic, allergic) is noted in the ROS questionnaire.  It is reviewed with the patient.   Family health history: No HTN, DM, CAD, hearing loss or bleeding disorder.  Major events: None.  Ongoing medical problems: Atrial fibrillation.  Social history: The patient is married. He denies the use of tobacco, alcohol or illegal drugs.   Exam: General: Communicates without difficulty, well nourished, no acute distress. Head: Normocephalic, no evidence injury, no tenderness, facial buttresses intact without stepoff. Eyes: PERRL, EOMI. No scleral icterus, conjunctivae clear. Neuro: CN II exam reveals vision grossly intact.  No nystagmus at any point of gaze. Ears: Auricles well formed without lesions.  Ear canals are intact without mass or lesion.  No erythema or edema is appreciated.  The TMs are intact without fluid. Nose: External evaluation reveals normal support and skin without lesions.  Dorsum is intact.  Anterior rhinoscopy reveals congested and edematous mucosa over anterior aspect of the inferior turbinates and nasal septum.  No purulence is noted. Middle meatus is not well visualized. Oral:  Oral cavity and oropharynx are intact, symmetric, without erythema or edema.  Mucosa is moist without lesions.  Neck: Full range of motion without pain.  There is no significant lymphadenopathy.  No masses palpable.  Thyroid bed within normal limits to palpation.  Parotid glands and submandibular glands equal bilaterally without mass.  Trachea is midline. Neuro:  CN 2-12 grossly intact. Gait normal. Vestibular: No nystagmus at any point of gaze.   Procedure: Flexible Nasal Endoscopy Description: Risks, benefits, and alternatives of flexible endoscopy were explained to the patient. Specific mention was made of the risk of throat numbness with difficulty swallowing, possible bleeding from the nose and mouth, and pain from the procedure. The patient gave oral consent to proceed.  The flexible scope was inserted into the right nasal cavity. NSD. Endoscopy of the interior nasal cavity, superior, inferior, and middle meatus was performed. The sphenoid-ethmoid recess was examined. Edematous mucosa was noted. No polyp, mass, or lesion was appreciated. Olfactory cleft was clear. Nasopharynx was clear. Turbinates were hypertrophied but without mass. The procedure was repeated on the contralateral side with similar findings. The patient tolerated the procedure well.   Assessment  1. Chronic rhinitis without evidence of acute sinusitis. No purulent drainage, polyps, or other suspicious mass or lesion is noted on today's nasal endoscopy. 2. Bidirectional septal deviation is noted, worse on the left with bilateral inferior turbinate hypertrophy. This results in significant nasal obstruction.   Plan: 1. The physical exam and nasal endoscopy findings are reviewed with the patient.  2. Treatment options include conservative management with daily steroid nasal spray versus septoplasty and bilateral inferior turbinate reduction. The risks, benefits, alternatives, and details of the procedure are reviewed with the patient. Questions are invited and answered. 3. The patient would like to proceed with the procedures.

## 2020-06-19 NOTE — Transfer of Care (Signed)
Immediate Anesthesia Transfer of Care Note  Patient: Justin Carson  Procedure(s) Performed: NASAL SEPTOPLASTY WITH TURBINATE REDUCTION (Bilateral Nose)  Patient Location: PACU  Anesthesia Type:General  Level of Consciousness: drowsy  Airway & Oxygen Therapy: Patient Spontanous Breathing and Patient connected to face mask oxygen  Post-op Assessment: Report given to RN and Post -op Vital signs reviewed and stable  Post vital signs: Reviewed and stable  Last Vitals:  Vitals Value Taken Time  BP 129/79 06/19/20 1015  Temp    Pulse 71 06/19/20 1016  Resp    SpO2 99 % 06/19/20 1016  Vitals shown include unvalidated device data.  Last Pain:  Vitals:   06/19/20 0759  TempSrc: Oral  PainSc: 0-No pain      Patients Stated Pain Goal: 3 (51/83/43 7357)  Complications: No complications documented.

## 2020-06-19 NOTE — Anesthesia Preprocedure Evaluation (Signed)
Anesthesia Evaluation  Patient identified by MRN, date of birth, ID band Patient awake    Reviewed: Allergy & Precautions, NPO status , Patient's Chart, lab work & pertinent test results  Airway Mallampati: I  TM Distance: >3 FB Neck ROM: Full    Dental   Pulmonary    Pulmonary exam normal        Cardiovascular Normal cardiovascular exam+ dysrhythmias Atrial Fibrillation      Neuro/Psych    GI/Hepatic   Endo/Other    Renal/GU      Musculoskeletal   Abdominal   Peds  Hematology   Anesthesia Other Findings   Reproductive/Obstetrics                             Anesthesia Physical Anesthesia Plan  ASA: III  Anesthesia Plan: General   Post-op Pain Management:    Induction: Intravenous  PONV Risk Score and Plan: 2 and Ondansetron and Treatment may vary due to age or medical condition  Airway Management Planned: Oral ETT  Additional Equipment:   Intra-op Plan:   Post-operative Plan: Extubation in OR  Informed Consent: I have reviewed the patients History and Physical, chart, labs and discussed the procedure including the risks, benefits and alternatives for the proposed anesthesia with the patient or authorized representative who has indicated his/her understanding and acceptance.       Plan Discussed with: CRNA and Surgeon  Anesthesia Plan Comments:         Anesthesia Quick Evaluation

## 2020-06-19 NOTE — Discharge Instructions (Addendum)

## 2020-06-20 ENCOUNTER — Encounter (HOSPITAL_BASED_OUTPATIENT_CLINIC_OR_DEPARTMENT_OTHER): Payer: Self-pay | Admitting: Otolaryngology

## 2020-09-13 ENCOUNTER — Telehealth: Payer: Self-pay | Admitting: Cardiology

## 2020-09-13 NOTE — Telephone Encounter (Signed)
3.9.22 LVM to schd 1 yr fu w/Dr. Ellyn Hack LP

## 2020-12-19 IMAGING — CT CT HEART MORP W/ CTA COR W/ SCORE W/ CA W/CM &/OR W/O CM
4 of 7 series · 8 of 20 positions shown, 9 images · IV contrast (APPLIED)
Comparison: None.
COMPARISON: None.

Addendum:
EXAM:
OVER-READ INTERPRETATION  CT CHEST

The following report is an over-read performed by radiologist Dr.
Alajie Misceo [REDACTED] on 03/19/2019. This
over-read does not include interpretation of cardiac or coronary
anatomy or pathology. The coronary calcium score/coronary CTA
interpretation by the cardiologist is attached.
CLINICAL DATA: Chest pain
Cardiac CTA
MEDICATIONS:
Sub lingual nitro. 4mg x 2
TECHNIQUE: The patient was scanned on a Siemens [REDACTED]ice scanner. Gantry
rotation speed was 250 msecs. Collimation was 0.6 mm. A 100 kV
prospective scan was triggered in the ascending thoracic aorta at
35-75% of the R-R interval. Average HR during the scan was 60 bpm.
The 3D data set was interpreted on a dedicated work station using
MPR, MIP and VRT modes. A total of 80cc of contrast was used.

[Series 6: best diast 69 % · axial · 0.39mm/px · z∈[-96,-48]mm · 2 of 358 slices shown, 3 images]
[im 120/358  vessel]
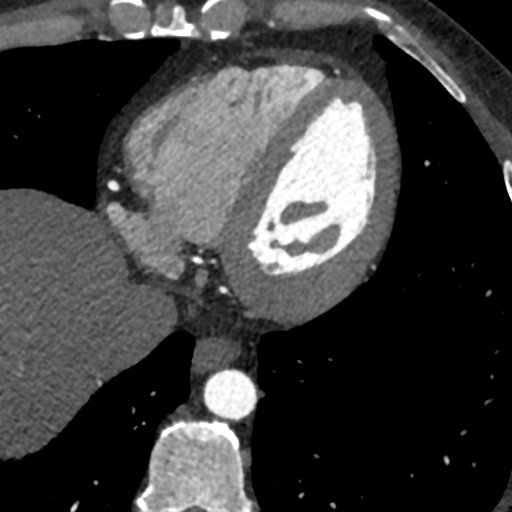
[im 120/358  lung]
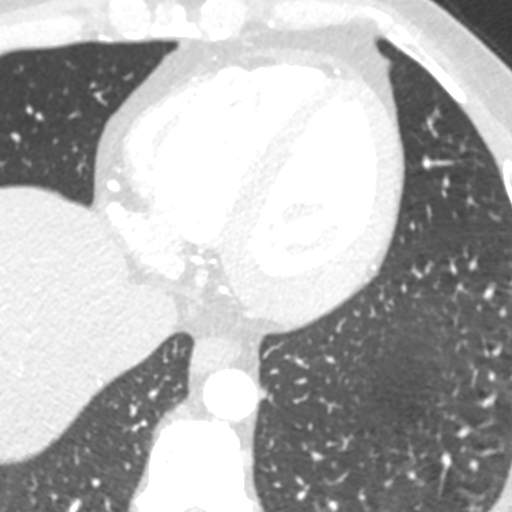
[im 239/358  vessel]
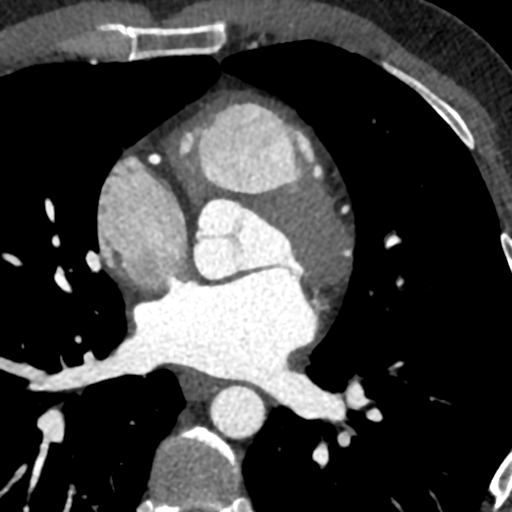

[Series 7: best syst 69 % · axial · 0.39mm/px · z∈[-96,-48]mm · 2 of 358 slices shown]
[im 120/358  vessel]
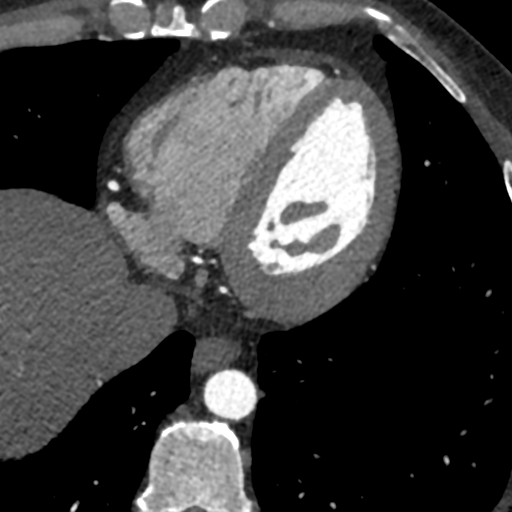
[im 239/358  vessel]
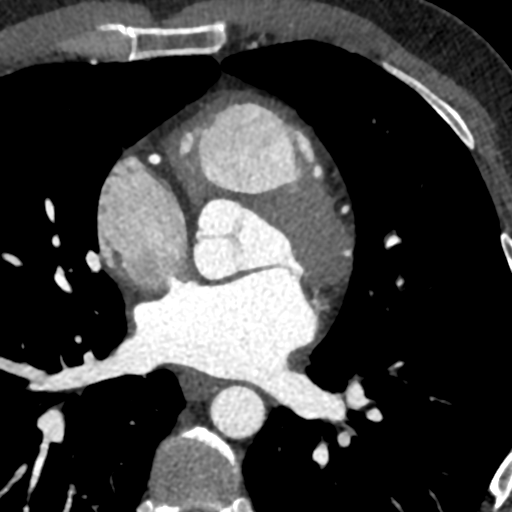

[Series 8: ts diast sharp 69 % · axial · 0.39mm/px · z∈[-96,-48]mm · 2 of 358 slices shown]
[im 120/358  lung]
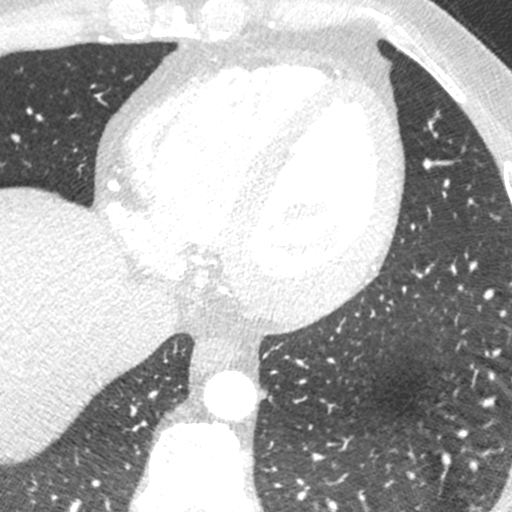
[im 239/358  lung]
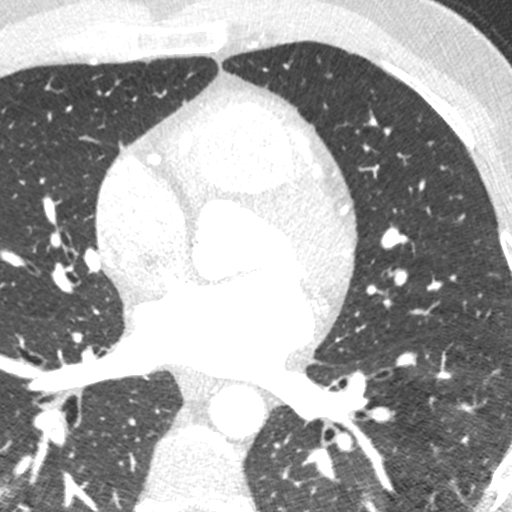

[Series 9: ts syst sharp 69 % · axial · 0.39mm/px · z∈[-96,-48]mm · 2 of 358 slices shown]
[im 120/358  lung]
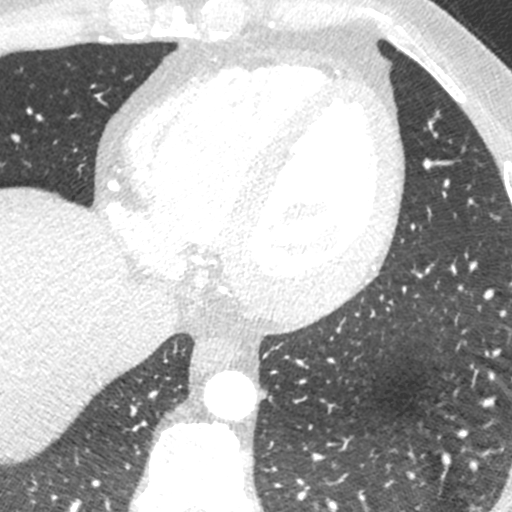
[im 239/358  lung]
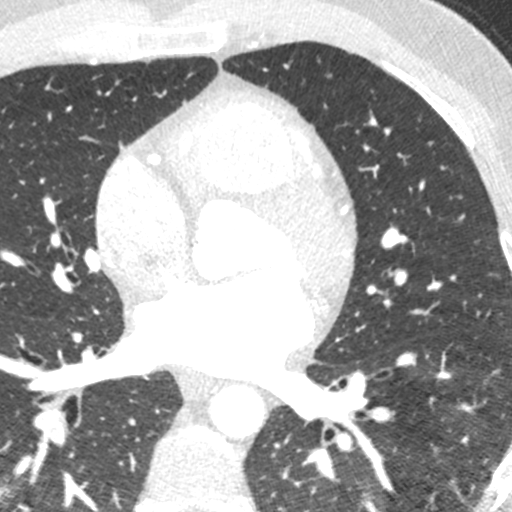

[8 of 20 positions shown; findings below may reference images not displayed]

FINDINGS: Within the visualized portions of the thorax there are no suspicious
appearing pulmonary nodules or masses, there is no acute
consolidative airspace disease, no pleural effusions, no
pneumothorax and no lymphadenopathy. Visualized portions of the
upper abdomen are unremarkable. There are no aggressive appearing
lytic or blastic lesions noted in the visualized portions of the
skeleton.
IMPRESSION: 1. No significant incidental noncardiac findings are noted
FINDINGS: Non-cardiac: See separate report from [REDACTED].

The pulmonary veins drain normally to the left atrium. There is a
small secundum ASD with left to right flow, 6 x 6 mm. The right
ventricle appears mildly dilated and the right atrium appears
moderately dilated.

Calcium Score: 0 Agatston units.

Coronary Arteries: Right dominant with no anomalies

LM: No plaque or stenosis.

LAD system: No plaque or stenosis.

Circumflex system: Large branching ramus, no plaque or stenosis.
Small AV LCx, no plaque or stenosis.

RCA system: No plaque or stenosis.
IMPRESSION: 1. Coronary artery calcium score 0 Agatston units. This suggests low
risk for future cardiac events.

2.  No significant coronary disease noted.

3.  Small secundum ASD as described above.

Amaury Kari

*** End of Addendum ***
EXAM:
OVER-READ INTERPRETATION  CT CHEST

The following report is an over-read performed by radiologist Dr.
Alajie Misceo [REDACTED] on 03/19/2019. This
over-read does not include interpretation of cardiac or coronary
anatomy or pathology. The coronary calcium score/coronary CTA
interpretation by the cardiologist is attached.
FINDINGS: Within the visualized portions of the thorax there are no suspicious
appearing pulmonary nodules or masses, there is no acute
consolidative airspace disease, no pleural effusions, no
pneumothorax and no lymphadenopathy. Visualized portions of the
upper abdomen are unremarkable. There are no aggressive appearing
lytic or blastic lesions noted in the visualized portions of the
skeleton.
IMPRESSION: 1. No significant incidental noncardiac findings are noted

## 2021-01-12 ENCOUNTER — Telehealth: Payer: Self-pay | Admitting: *Deleted

## 2021-01-12 ENCOUNTER — Telehealth (INDEPENDENT_AMBULATORY_CARE_PROVIDER_SITE_OTHER): Payer: BC Managed Care – PPO | Admitting: Cardiology

## 2021-01-12 ENCOUNTER — Encounter: Payer: Self-pay | Admitting: Cardiology

## 2021-01-12 VITALS — BP 124/72 | HR 66 | Ht 73.0 in | Wt 208.0 lb

## 2021-01-12 DIAGNOSIS — I480 Paroxysmal atrial fibrillation: Secondary | ICD-10-CM

## 2021-01-12 DIAGNOSIS — I498 Other specified cardiac arrhythmias: Secondary | ICD-10-CM

## 2021-01-12 DIAGNOSIS — N5201 Erectile dysfunction due to arterial insufficiency: Secondary | ICD-10-CM | POA: Insufficient documentation

## 2021-01-12 DIAGNOSIS — Z5689 Other problems related to employment: Secondary | ICD-10-CM | POA: Diagnosis not present

## 2021-01-12 MED ORDER — SILDENAFIL CITRATE 25 MG PO TABS: 25.0000 mg | ORAL_TABLET | ORAL | 6 refills | Status: DC | PRN

## 2021-01-12 NOTE — Progress Notes (Signed)
Virtual Visit via Telephone Note   This visit type was conducted due to national recommendations for restrictions regarding the COVID-19 Pandemic (e.g. social distancing) in an effort to limit this patient's exposure and mitigate transmission in our community.  Due to his co-morbid illnesses, this patient is at least at moderate risk for complications without adequate follow up.  This format is felt to be most appropriate for this patient at this time.  The patient did not have access to video technology/had technical difficulties with video requiring transitioning to audio format only (telephone).  All issues noted in this document were discussed and addressed.  No physical exam could be performed with this format.  Please refer to the patient's chart for his  consent to telehealth for [facility].   Patient has given verbal permission to conduct this visit via virtual appointment and to bill insurance 01/12/2021 5:09 PM     Evaluation Performed:  Follow-up visit  Date:  01/12/2021   ID:  Justin Carson, DOB [birth year], MRN [id number]  Patient Location: Home Provider Location: Home Office  PCP:  Pleas Koch, NP  Cardiologist:  Glenetta Hew, MD  Electrophysiologist:  None   Chief Complaint:   Chief Complaint  Patient presents with   Follow-up    No major complaints. ->  Asked questions about ED   Atrial Fibrillation    No recurrent episodes.  Just has brief palpitations lasting less than a few seconds. ->  Doing well, avoids caffeine.     ====================================  ASSESSMENT & PLAN:    Problem List Items Addressed This Visit     PAF (paroxysmal atrial fibrillation) (HCC) - Primary (Chronic)    Minimal burden on monitor in the past.As far as he can tell he has not had any breakthrough episodes since I last saw him.  He may have a few palpitations which are probably more related to atrial bigeminy.  Continue to avoid caffeine.. CHA2DS2-VASc score is 0,  therefore we will hold off on LHC.  Continue aspirin 81 mg daily.       Relevant Medications   aspirin EC 81 MG tablet   sildenafil (VIAGRA) 25 MG tablet   Other Relevant Orders   Comprehensive metabolic panel   Lipid panel   Atrial bigeminy (Chronic)    I think physical palpitation spells are probably more related to a atrial bigeminy than actual A. Fib.  Well controlled by avoiding caffeine. No need for medical management at this point.       Relevant Medications   aspirin EC 81 MG tablet   sildenafil (VIAGRA) 25 MG tablet   Erectile dysfunction due to arterial insufficiency (Chronic)    This may be the signs of initial vascular disease.  I provided prescription for sildenafil.  Would also want a follow-up with checking testosterone levels as well as review his cardiac risk factors such as lipid panel.       Relevant Medications   aspirin EC 81 MG tablet   sildenafil (VIAGRA) 25 MG tablet   Other Relevant Orders   Comprehensive metabolic panel   Testosterone   Lipid panel   Other problems related to employment    He has a CDL license renewal, and in the past he has been issues because of his diagnosis with A. Fib. I will dictate a letter to to put in his chart.  He has MyChart and can access the letter.       Relevant Orders   Comprehensive metabolic panel  Lipid panel    ====================================  History of Present Illness:    Justin Carson is a 50 y.o. male with PMH notable for PAF (CHA2DS2Vasc - 0) who presents via [employer], civil (consulting) for a telehealth visit today as a ~15 month f/u.  Justin Carson was last seen 09/09/2019 via Telemedicine - reviewed GXT (abnormal), TTE & Cor CTA (CAC ZERO, NO notable CAD plaque).  ->  At that time he was doing fairly well.  He Prom[employer] figured out that his palpitation episodes were triggered by stress and caffeine.  He was really only having short-lived episodes which may very well of just been atrial bigeminy  as opposed to true A. fib.  Nothing was prolonged more than a minute or 2.  No associated symptoms.  Hospitalizations:  06/19/2020 he had Nasal Septoplasty with Turbinate Reduction.   Recent - Interim CV studies:   The following studies were reviewed today: None:  Inerval History   Justin Carson is being evaluated today via telemedicine for delayed follow-up doing quite well.  He had COVID about a month or so ago and had several days of fever and headache with congestion, but has seemed to recovered since.  No episodes of palpitations during that time.  Really, as long as he minimizes caffeine, the palpitations are fine.  When he does start on palpitations he will cut back on caffeine and sweets.  He really has not had any irregular heartbeats lasting more than a couple seconds in the last year or so.  Nothing to suggest recurrent A. fib..  Cardiovascular ROS: positive for - still rare palpitations - short lived only negative for - chest pain, dyspnea on exertion, edema, orthopnea, paroxysmal nocturnal dyspnea, shortness of breath, or any prolonged irregular heartbeats, lightheadedness, dizziness or wooziness.  Syncope/near syncope or TIA/amaurosis fugax, claudication  His blood pressures been pretty well controlled, but he notes that he is having issues with erectile dysfunction.  He is not having difficulty gaining an erection, and having hard time maintaining.  ROS:  Please see the history of present illness.     Review of Systems  Constitutional:  Negative for malaise/fatigue and weight loss.  HENT:  Negative for congestion.   Respiratory:         Had about 2 weeks of dry cough after COVID-19 infection last month.  No longer having headaches   Cardiovascular:  Positive for palpitations.  Gastrointestinal:  Negative for blood in stool.  Genitourinary:  Negative for hematuria.       Notes difficulty maintaining erection  Musculoskeletal:  Negative for joint pain.  Neurological:  Negative.   Psychiatric/Behavioral: Negative.      Past Medical History:  Diagnosis Date   Complication of anesthesia    "laughing gas during wisdom teeth did not take affect per patient"   Hx of adenomatous colonic polyps 08/26/2019   PAF (paroxysmal atrial fibrillation) ([place])    CHA2DS2Vasc = 0; Patient is in a-fib occasionally.  Takes ASA only.   Shingles    Past Surgical History:  Procedure Laterality Date   CORONARY CT ANGIOGRAM  03/2019   Coronary calcium score 0.  No significant CAD.  Small secundum   EXERCISE TOLERANCE TEST  02/2019   Exercised for 13:26 min; 3 mm in V1 ST elevation noted at 10 minutes of stress.->  Referred for coronary CTA, no CAD noted.  Coronary calcium score 0   FOOT SURGERY     age 24    foot wound closure  at age 62   NASAL SEPTOPLASTY W/ TURBINOPLASTY Bilateral 06/19/2020   Procedure: NASAL SEPTOPLASTY WITH TURBINATE REDUCTION;  Surgeon: Leta Baptist, MD;  Location: [place];  Service: ENT;  Laterality: Bilateral;   TRANSTHORACIC ECHOCARDIOGRAM  01/2019   Normal LV size and function.  EF 55 to 60%.  Normal valves.   WISDOM TOOTH EXTRACTION       Current Meds  Medication Sig   acetaminophen (TYLENOL) 500 MG tablet Take 1,000 mg by mouth every 6 (six) hours as needed for moderate pain.   aspirin EC 81 MG tablet Take 81 mg by mouth daily. Swallow whole.   Polyethylene Glycol 3350 (MIRALAX PO) Take by mouth as directed.     Allergies:   Patient has no known allergies.   Social History   Tobacco Use   Smoking status: Never   Smokeless tobacco: Never  Vaping Use   Vaping Use: Never used  Substance Use Topics   Alcohol use: No   Drug use: No     Family Hx: The patient's family history includes COPD in his mother; Hypertension in his father; Liver disease in his father; Lung cancer in his mother; Throat cancer in his mother. There is no history of Colon cancer, Colon polyps, Esophageal cancer, Stomach cancer, or Rectal  cancer.   Labs/Other Tests and Data Reviewed:    EKG:  No ECG reviewed.  Recent Labs: No results found for requested labs within last 8760 hours.   Recent Lipid Panel Lab Results  Component Value Date/Time   CHOL 196 07/08/2019 09:51 AM   TRIG 67.0 07/08/2019 09:51 AM   HDL 44.70 07/08/2019 09:51 AM   CHOLHDL 4 07/08/2019 09:51 AM   LDLCALC 138 (H) 07/08/2019 09:51 AM    Wt Readings from Last 3 Encounters:  01/12/21 208 lb (94.3 kg)  06/19/20 219 lb 12.8 oz (99.7 kg)  09/09/19 205 lb (93 kg)     Objective:    Vital Signs:  BP 124/72   Pulse 66   Ht 6\' 1"  (1.854 m)   Wt 208 lb (94.3 kg)   BMI 27.44 kg/m   120s/70s on average Well nourished, well developed male in no acute distress. A&O x 3.  normal Mood & Affect Non-labored respirations  ==========================================  COVID-19 Education: The signs and symptoms of COVID-19 were discussed with the patient and how to seek care for testing (follow up with PCP or arrange E-visit).   The importance of social distancing was discussed today.  Time:   Today, I have spent 15 minutes with the patient with telehealth technology discussing the above problems.   An additional 15 minutes spent charting (reviewing prior notes, hospital records, studies, labs etc.) Total50minutes   Medication Adjustments/Labs and Tests Ordered: Current medicines are reviewed at length with the patient today.  Concerns regarding medicines are outlined above.   Patient Instructions  Medication Instructions:   Sildenafil 25 mg PO As needed (for Erectile Dysfunction) -Disp #10, 6 refill.  *If you need a refill on your cardiac medications before your next appointment, please call your pharmacy*   Lab Work:  CMP / FLP, Testosterone  If you have labs (blood work) drawn today and your tests are completely normal, you will receive your results only by: [place] (if you have MyChart) OR A paper copy in the mail If you have  any lab test that is abnormal or we need to change your treatment, we will call you to review the results.  Testing/Procedures: none   Follow-Up: At [facility], you and your health needs are our priority.  As part of our continuing mission to provide you with exceptional heart care, we have created designated Provider Care Teams.  These Care Teams include your primary Cardiologist (physician) and Advanced Practice Providers (APPs -  Physician Assistants and Nurse Practitioners) who all work together to provide you with the care you need, when you need it.    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   Signed, Glenetta Hew, MD  01/12/2021 5:09 PM    [place]

## 2021-01-12 NOTE — Assessment & Plan Note (Signed)
I think physical palpitation spells are probably more related to a atrial bigeminy than actual A. Fib.  Well controlled by avoiding caffeine. No need for medical management at this point.

## 2021-01-12 NOTE — Assessment & Plan Note (Signed)
Minimal burden on monitor in the past.As far as he can tell he has not had any breakthrough episodes since I last saw him.  He may have a few palpitations which are probably more related to atrial bigeminy.  Continue to avoid caffeine.. CHA2DS2-VASc score is 0, therefore we will hold off on LHC.  Continue aspirin 81 mg daily.

## 2021-01-12 NOTE — Telephone Encounter (Signed)
RN left detail message about Instruction  from today's virtual visit 01/12/21 .  AVS SUMMARY has been sent by MyChart and mailed [facility]ilt lab slip  .  Recalled placed for visit 12 month ( 2023)

## 2021-01-12 NOTE — Telephone Encounter (Signed)
  Patient Consent for Virtual Visit        Justin Carson has provided verbal consent on 01/12/2021 for a virtual visit (video or telephone).   CONSENT FOR VIRTUAL VISIT FOR:  Justin Carson  By participating in this virtual visit I agree to the following:  I hereby voluntarily request, consent and authorize [facility] and its employed or contracted physicians, physician assistants, nurse practitioners or other licensed health care professionals (the Practitioner), to provide me with telemedicine health care services (the "Services") as deemed necessary by the treating Practitioner. I acknowledge and consent to receive the Services by the Practitioner via telemedicine. I understand that the telemedicine visit will involve communicating with the Practitioner through live audiovisual communication technology and the disclosure of certain medical information by electronic transmission. I acknowledge that I have been given the opportunity to request an in-person assessment or other available alternative prior to the telemedicine visit and am voluntarily participating in the telemedicine visit.  I understand that I have the right to withhold or withdraw my consent to the use of telemedicine in the course of my care at any time, without affecting my right to future care or treatment, and that the Practitioner or I may terminate the telemedicine visit at any time. I understand that I have the right to inspect all information obtained and/or recorded in the course of the telemedicine visit and may receive copies of available information for a reasonable fee.  I understand that some of the potential risks of receiving the Services via telemedicine include:  Delay or interruption in medical evaluation due to technological equipment failure or disruption; Information transmitted may not be sufficient (e.g. poor resolution of images) to allow for appropriate medical decision making by the Practitioner; and/or   In rare instances, security protocols could fail, causing a breach of personal health information.  Furthermore, I acknowledge that it is my responsibility to provide information about my medical history, conditions and care that is complete and accurate to the best of my ability. I acknowledge that Practitioner's advice, recommendations, and/or decision may be based on factors not within their control, such as incomplete or inaccurate data provided by me or distortions of diagnostic images or specimens that may result from electronic transmissions. I understand that the practice of medicine is not an exact science and that Practitioner makes no warranties or guarantees regarding treatment outcomes. I acknowledge that a copy of this consent can be made available to me via my patient portal ([facility]), or I can request a printed copy by calling the office of [facility].    I understand that my insurance will be billed for this visit.   I have read or had this consent read to me. I understand the contents of this consent, which adequately explains the benefits and risks of the Services being provided via telemedicine.  I have been provided ample opportunity to ask questions regarding this consent and the Services and have had my questions answered to my satisfaction. I give my informed consent for the services to be provided through the use of telemedicine in my medical care

## 2021-01-12 NOTE — Assessment & Plan Note (Signed)
He has a Careers information officer, and in the past he has been issues because of his diagnosis with A. Fib. I will dictate a letter to to put in his chart.  He has MyChart and can access the letter.

## 2021-01-12 NOTE — Assessment & Plan Note (Signed)
This may be the signs of initial vascular disease.  I provided prescription for sildenafil.  Would also want a follow-up with checking testosterone levels as well as review his cardiac risk factors such as lipid panel.

## 2021-01-12 NOTE — Patient Instructions (Addendum)
Medication Instructions:   Sildenafil 25 mg PO As needed (for Erectile Dysfunction) -Disp #10, 6 refill.  *If you need a refill on your cardiac medications before your next appointment, please call your pharmacy*   Lab Work:  CMP / FLP, Testosterone  If you have labs (blood work) drawn today and your tests are completely normal, you will receive your results only by: [facility] (if you have MyChart) OR A paper copy in the mail If you have any lab test that is abnormal or we need to change your treatment, we will call you to review the results.   Testing/Procedures: none   Follow-Up: At [facility], you and your health needs are our priority.  As part of our continuing mission to provide you with exceptional heart care, we have created designated Provider Care Teams.  These Care Teams include your primary Cardiologist (physician) and Advanced Practice Providers (APPs -  Physician Assistants and Nurse Practitioners) who all work together to provide you with the care you need, when you need it.    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

## 2021-01-22 LAB — COMPREHENSIVE METABOLIC PANEL
ALT: 11 IU/L (ref 0–44)
AST: 17 IU/L (ref 0–40)
Albumin/Globulin Ratio: 1.6 (ref 1.2–2.2)
Albumin: 4.4 g/dL (ref 3.8–4.9)
Alkaline Phosphatase: 74 IU/L (ref 44–121)
BUN/Creatinine Ratio: 10 (ref 9–20)
BUN: 13 mg/dL (ref 6–24)
Bilirubin Total: 0.6 mg/dL (ref 0.0–1.2)
CO2: 24 mmol/L (ref 20–29)
Calcium: 9.7 mg/dL (ref 8.7–10.2)
Chloride: 101 mmol/L (ref 96–106)
Creatinine, Ser: 1.27 mg/dL (ref 0.76–1.27)
Globulin, Total: 2.7 g/dL (ref 1.5–4.5)
Glucose: 90 mg/dL (ref 65–99)
Potassium: 5.2 mmol/L (ref 3.5–5.2)
Sodium: 140 mmol/L (ref 134–144)
Total Protein: 7.1 g/dL (ref 6.0–8.5)
eGFR: 68 mL/min/{1.73_m2} (ref >59)

## 2021-01-22 LAB — LIPID PANEL
Chol/HDL Ratio: 3.8 ratio (ref 0.0–5.0)
Cholesterol, Total: 176 mg/dL (ref 100–199)
HDL: 46 mg/dL (ref >39)
LDL Chol Calc (NIH): 117 mg/dL — ABNORMAL HIGH (ref 0–99)
Triglycerides: 70 mg/dL (ref 0–149)
VLDL Cholesterol Cal: 13 mg/dL (ref 5–40)

## 2021-01-22 LAB — TESTOSTERONE: Testosterone: 743 ng/dL (ref 264–916)

## 2021-01-26 ENCOUNTER — Other Ambulatory Visit: Payer: Self-pay | Admitting: *Deleted

## 2021-01-26 ENCOUNTER — Encounter: Payer: Self-pay | Admitting: *Deleted

## 2021-01-26 DIAGNOSIS — E785 Hyperlipidemia, unspecified: Secondary | ICD-10-CM

## 2022-01-15 ENCOUNTER — Ambulatory Visit (INDEPENDENT_AMBULATORY_CARE_PROVIDER_SITE_OTHER): Payer: BC Managed Care – PPO | Admitting: Physician Assistant

## 2022-01-15 VITALS — BP 120/80 | HR 52 | Ht 73.0 in | Wt 216.0 lb

## 2022-01-15 DIAGNOSIS — I480 Paroxysmal atrial fibrillation: Secondary | ICD-10-CM

## 2022-01-15 NOTE — Progress Notes (Unsigned)
Cardiology Office Note:    Date:  01/17/2022   ID:  Justin Carson, DOB [birth year], MRN [id number]  PCP:  Pleas Koch, NP   [facility] Providers Cardiologist:  Glenetta Hew, MD     Referring MD: Pleas Koch, NP   Chief Complaint  Patient presents with   Follow-up    1 year.    History of Present Illness:    Justin Carson is a 51 y.o. male with a hx of PAF and history of shingles.  Previous heart monitor obtained in May 2020 revealed symptomatic atrial fibrillation with intermittent RVR, overall A-fib burden of 4%, total of 8 episodes of A-fib were documented, longest lasting up to 1 hour and 22 minutes.  A-fib burden was 4%.  Given low CHA2DS2-VASc score, he was not placed on anticoagulation or rate control medication.  He was last seen in the office in June 2020.  Echocardiogram obtained on 01/15/2019 showed EF 55 to 60%, no significant valve issue.  ETT obtained on 02/12/2019 showed ST elevation of 3 mm noted in V1, aVR and aVL, he was able to exercise for 13 minutes on the treadmill.  Coronary CT obtained on 03/19/2019 showed a coronary calcium score 0, no evidence of coronary artery disease, small secundum ASD present.  He was last seen via virtual visit by Dr. Ellyn Hack on 01/12/2021 at which time he was doing well.  Patient presents today for follow-up.  He denies any recent chest pain or worsening dyspnea.  He has no lower extremity edema, orthopnea or PND.  He may have 1 episode of atrial fibrillation a month, this typically last around 30 minutes or so.  Given lack of significant recurrence, he was not placed on any anticoagulation therapy.  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  He can follow-up in 1 year.   Past Medical History:  Diagnosis Date   Complication of anesthesia    "laughing gas during wisdom teeth did not take affect per patient"   Hx of adenomatous colonic polyps 08/26/2019   PAF (paroxysmal atrial fibrillation) ([facility])     CHA2DS2Vasc = 0; Patient is in a-fib occasionally.  Takes ASA only.   Shingles     Past Surgical History:  Procedure Laterality Date   CORONARY CT ANGIOGRAM  03/2019   Coronary calcium score 0.  No significant CAD.  Small secundum   EXERCISE TOLERANCE TEST  02/2019   Exercised for 13:26 min; 3 mm in V1 ST elevation noted at 10 minutes of stress.->  Referred for coronary CTA, no CAD noted.  Coronary calcium score 0   FOOT SURGERY     age 39    foot wound closure     at age 46   NASAL SEPTOPLASTY W/ TURBINOPLASTY Bilateral 06/19/2020   Procedure: NASAL SEPTOPLASTY WITH TURBINATE REDUCTION;  Surgeon: Leta Baptist, MD;  Location: [facility];  Service: ENT;  Laterality: Bilateral;   TRANSTHORACIC ECHOCARDIOGRAM  01/2019   Normal LV size and function.  EF 55 to 60%.  Normal valves.   WISDOM TOOTH EXTRACTION      Current Medications: Current Meds  Medication Sig   acetaminophen (TYLENOL) 500 MG tablet Take 1,000 mg by mouth every 6 (six) hours as needed for moderate pain.   aspirin EC 81 MG tablet Take 81 mg by mouth daily. Swallow whole.   Polyethylene Glycol 3350 (MIRALAX PO) Take by mouth as directed.   sildenafil (VIAGRA) 25 MG tablet Take 1 tablet (  25 mg total) by mouth as needed for erectile dysfunction.     Allergies:   Patient has no known allergies.   Social History   Socioeconomic History   Marital status: Married    Spouse name: Not on file   Number of children: 3   Years of education: Not on file   Highest education level: Not on file  Occupational History   Not on file  Tobacco Use   Smoking status: Never   Smokeless tobacco: Never  Vaping Use   Vaping Use: Never used  Substance and Sexual Activity   Alcohol use: No   Drug use: No   Sexual activity: Not on file  Other Topics Concern   Not on file  Social History Narrative   Married.   3 children.   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    Enjoys spending time with family.     Social Determinants of Health   Financial Resource Strain: Not on file  Food Insecurity: Not on file  Transportation Needs: Not on file  Physical Activity: Not on file  Stress: Not on file  Social Connections: Not on file     Family History: The patient's family history includes COPD in his mother; Hypertension in his father; Liver disease in his father; Lung cancer in his mother; Throat cancer in his mother. There is no history of Colon cancer, Colon polyps, Esophageal cancer, Stomach cancer, or Rectal cancer.  ROS:   Please see the history of present illness.     All other systems reviewed and are negative.  EKGs/Labs/Other Studies Reviewed:    The following studies were reviewed today:  Echo 01/15/2019  1. The left ventricle has normal systolic function, with an ejection  fraction of 55-60%. The cavity size was normal. Left ventricular diastolic  parameters were normal.   2. The right ventricle has normal systolic function. The cavity was  normal.   3. The mitral valve is grossly normal.   4. The tricuspid valve is grossly normal.   5. The aortic valve is tricuspid. No stenosis of the aortic valve.   6. Normal LV function; no significant valvular disease.   EKG:  EKG is ordered today.  The ekg ordered today demonstrates normal sinus rhythm, no significant ST-T wave changes.  Recent Labs: 01/22/2021: ALT 11; BUN 13; Creatinine, Ser 1.27; Potassium 5.2; Sodium 140  Recent Lipid Panel    Component Value Date/Time   CHOL 176 01/22/2021 0816   TRIG 70 01/22/2021 0816   HDL 46 01/22/2021 0816   CHOLHDL 3.8 01/22/2021 0816   CHOLHDL 4 07/08/2019 0951   VLDL 13.4 07/08/2019 0951   LDLCALC 117 (H) 01/22/2021 0816     Risk Assessment/Calculations:    CHA2DS2-VASc Score = 0   This indicates a 0.2% annual risk of stroke. The patient's score is based upon: CHF History: 0 HTN History: 0 Diabetes History: 0 Stroke History: 0 Vascular Disease History: 0 Age Score:  0 Gender Score: 0           Physical Exam:    VS:  BP 120/80 (BP Location: Left Arm, Patient Position: Sitting, Cuff Size: Normal)   Pulse (!) 52   Ht '6\' 1"'$  (1.854 m)   Wt 216 lb (98 kg)   BMI 28.50 kg/m     Wt Readings from Last 3 Encounters:  01/15/22 216 lb (98 kg)  01/12/21 208 lb (94.3 kg)  06/19/20 219 lb 12.8 oz (99.7 kg)  GEN:  Well nourished, well developed in no acute distress HEENT: Normal NECK: No JVD; No carotid bruits LYMPHATICS: No lymphadenopathy CARDIAC: RRR, no murmurs, rubs, gallops RESPIRATORY:  Clear to auscultation without rales, wheezing or rhonchi  ABDOMEN: Soft, non-tender, non-distended MUSCULOSKELETAL:  No edema; No deformity  SKIN: Warm and dry NEUROLOGIC:  Alert and oriented x 3 PSYCHIATRIC:  Normal affect   ASSESSMENT:    1. PAF (paroxysmal atrial fibrillation) (HCC)    PLAN:    In order of problems listed above:  PAF: Low CHA2DS2-VASc score of 0.  Given lack of significant recurrence, he was not placed on anticoagulation therapy.  Not on rate control medication either.           Medication Adjustments/Labs and Tests Ordered: Current medicines are reviewed at length with the patient today.  Concerns regarding medicines are outlined above.  Orders Placed This Encounter  Procedures   EKG 12-Lead   No orders of the defined types were placed in this encounter.   Patient Instructions  Medication Instructions:  Your physician recommends that you continue on your current medications as directed. Please refer to the Current Medication list given to you today.  *If you need a refill on your cardiac medications before your next appointment, please call your pharmacy*   Lab Work: NONE If you have labs (blood work) drawn today and your tests are completely normal, you will receive your results only by: [facility] (if you have MyChart) OR A paper copy in the mail If you have any lab test that is abnormal or we need to  change your treatment, we will call you to review the results.   Testing/Procedures: NONE   Follow-Up: At [facility], you and your health needs are our priority.  As part of our continuing mission to provide you with exceptional heart care, we have created designated Provider Care Teams.  These Care Teams include your primary Cardiologist (physician) and Advanced Practice Providers (APPs -  Physician Assistants and Nurse Practitioners) who all work together to provide you with the care you need, when you need it.  Your next appointment:   1 year(s)  The format for your next appointment:   In Person  Provider:   Glenetta Hew, MD      Signed, Almyra Deforest, [place]  01/17/2022 10:43 PM    [place]

## 2022-01-15 NOTE — Patient Instructions (Signed)
Medication Instructions:  Your physician recommends that you continue on your current medications as directed. Please refer to the Current Medication list given to you today.  *If you need a refill on your cardiac medications before your next appointment, please call your pharmacy*   Lab Work: NONE If you have labs (blood work) drawn today and your tests are completely normal, you will receive your results only by: [facility] (if you have MyChart) OR A paper copy in the mail If you have any lab test that is abnormal or we need to change your treatment, we will call you to review the results.   Testing/Procedures: NONE   Follow-Up: At [facility], you and your health needs are our priority.  As part of our continuing mission to provide you with exceptional heart care, we have created designated Provider Care Teams.  These Care Teams include your primary Cardiologist (physician) and Advanced Practice Providers (APPs -  Physician Assistants and Nurse Practitioners) who all work together to provide you with the care you need, when you need it.  Your next appointment:   1 year(s)  The format for your next appointment:   In Person  Provider:   Glenetta Hew, MD

## 2022-06-28 ENCOUNTER — Encounter: Payer: Self-pay | Admitting: Cardiology

## 2022-06-28 ENCOUNTER — Other Ambulatory Visit: Payer: Self-pay | Admitting: Cardiology

## 2022-06-28 NOTE — Telephone Encounter (Signed)
OK to fill.   Make sure he knows - NO nitrates within 24 hr either way of taking sildenifil  [facility]

## 2022-10-19 ENCOUNTER — Encounter: Payer: Self-pay | Admitting: Cardiology

## 2022-10-19 DIAGNOSIS — I480 Paroxysmal atrial fibrillation: Secondary | ICD-10-CM

## 2022-11-15 ENCOUNTER — Ambulatory Visit (HOSPITAL_COMMUNITY)
Admission: RE | Admit: 2022-11-15 | Discharge: 2022-11-15 | Disposition: A | Discharge status: Home / Self Care | Payer: BC Managed Care – PPO | Source: Ambulatory Visit | Attending: Internal Medicine | Admitting: Internal Medicine

## 2022-11-15 VITALS — BP 138/88 | HR 58 | Ht 73.0 in | Wt 220.6 lb

## 2022-11-15 DIAGNOSIS — E669 Obesity, unspecified: Secondary | ICD-10-CM | POA: Insufficient documentation

## 2022-11-15 DIAGNOSIS — Z7982 Long term (current) use of aspirin: Secondary | ICD-10-CM | POA: Diagnosis not present

## 2022-11-15 DIAGNOSIS — Z6829 Body mass index (BMI) 29.0-29.9, adult: Secondary | ICD-10-CM | POA: Diagnosis not present

## 2022-11-15 DIAGNOSIS — R0683 Snoring: Secondary | ICD-10-CM | POA: Diagnosis not present

## 2022-11-15 DIAGNOSIS — I480 Paroxysmal atrial fibrillation: Secondary | ICD-10-CM | POA: Diagnosis present

## 2022-11-15 LAB — COMPREHENSIVE METABOLIC PANEL
ALT: 23 U/L (ref 0–44)
AST: 23 U/L (ref 15–41)
Albumin: 4.3 g/dL (ref 3.5–5.0)
Alkaline Phosphatase: 52 U/L (ref 38–126)
Anion gap: 9 (ref 5–15)
BUN: 11 mg/dL (ref 6–20)
CO2: 29 mmol/L (ref 22–32)
Calcium: 9 mg/dL (ref 8.9–10.3)
Chloride: 103 mmol/L (ref 98–111)
Creatinine, Ser: 1.19 mg/dL (ref 0.61–1.24)
GFR, Estimated: >60 mL/min (ref >60)
Glucose, Bld: 122 mg/dL — ABNORMAL HIGH (ref 70–99)
Potassium: 4 mmol/L (ref 3.5–5.1)
Sodium: 141 mmol/L (ref 135–145)
Total Bilirubin: 1.2 mg/dL (ref 0.3–1.2)
Total Protein: 7 g/dL (ref 6.5–8.1)

## 2022-11-15 MED ORDER — MULTAQ 400 MG PO TABS: 400.0000 mg | ORAL_TABLET | Freq: Two times a day (BID) | ORAL | 3 refills | Status: DC

## 2022-11-15 NOTE — Progress Notes (Signed)
Primary Care Physician: Doreene Nest, NP Primary Cardiologist: Dr. Herbie Baltimore Primary Electrophysiologist: None Referring Physician: Dr. Oneida Arenas Justin Carson is a 52 y.o. male with a history of small secundum ASD and paroxysmal atrial fibrillation who presents for consultation in the [facility]. The patient was initially diagnosed with atrial fibrillation in May 2020 with heart monitor showing symptomatic Afib with intermittent RVR. Burden reduced after decreasing caffeine intake. Review of records show patient sent message on 4/13 noting frequency in Afib episodes. He takes ASA 81 mg daily. Patient is not on anticoagulation due to having a CHADS2VASC score of zero.  On evaluation today, he is in SR. He has noted increasing episodes of Afib over the past several weeks. He has checked using his wife's watch and I can review episodes of him going in and out of Afib. He does not appear to be in RVR with his Afib episodes. He feels an uncomfortable fluttering sensation when he is in it. He does not take anticoagulation at this time due to risk score of zero. No chest pain or shortness of breath.  Wife states he snores but does not stop breathing. He feels well rested when he wakes up.   Today, he denies symptoms of palpitations, chest pain, shortness of breath, orthopnea, PND, lower extremity edema, dizziness, presyncope, syncope, snoring, daytime somnolence, bleeding, or neurologic sequela. The patient is tolerating medications without difficulties and is otherwise without complaint today.   Atrial Fibrillation Risk Factors:  he does not have symptoms or diagnosis of sleep apnea. he does not have a history of rheumatic fever. he does have a history of alcohol use. The patient does not have a history of early familial atrial fibrillation or other arrhythmias.  he has a BMI of Body mass index is 29.1 kg/m.Marland Kitchen Filed Weights   11/15/22 1014  Weight: 100.1 kg     Family History  Problem Relation Age of Onset   Lung cancer Mother    COPD Mother    Throat cancer Mother    Hypertension Father    Liver disease Father    Colon cancer Neg Hx    Colon polyps Neg Hx    Esophageal cancer Neg Hx    Stomach cancer Neg Hx    Rectal cancer Neg Hx      Atrial Fibrillation Management history:  Previous antiarrhythmic drugs: None Previous cardioversions: None Previous ablations: None Anticoagulation history: None   Past Medical History:  Diagnosis Date   Complication of anesthesia    "laughing gas during wisdom teeth did not take affect per patient"   Hx of adenomatous colonic polyps 08/26/2019   PAF (paroxysmal atrial fibrillation) (HCC)    CHA2DS2Vasc = 0; Patient is in a-fib occasionally.  Takes ASA only.   Shingles    Past Surgical History:  Procedure Laterality Date   CORONARY CT ANGIOGRAM  03/2019   Coronary calcium score 0.  No significant CAD.  Small secundum   EXERCISE TOLERANCE TEST  02/2019   Exercised for 13:26 min; 3 mm in V1 ST elevation noted at 10 minutes of stress.->  Referred for coronary CTA, no CAD noted.  Coronary calcium score 0   FOOT SURGERY     age 46    foot wound closure     at age 34   NASAL SEPTOPLASTY W/ TURBINOPLASTY Bilateral 06/19/2020   Procedure: NASAL SEPTOPLASTY WITH TURBINATE REDUCTION;  Surgeon: Newman Pies, MD;  Location: [facility];  Service: ENT;  Laterality: Bilateral;   TRANSTHORACIC ECHOCARDIOGRAM  01/2019   Normal LV size and function.  EF 55 to 60%.  Normal valves.   WISDOM TOOTH EXTRACTION      Current Outpatient Medications  Medication Sig Dispense Refill   acetaminophen (TYLENOL) 500 MG tablet Take 1,000 mg by mouth every 6 (six) hours as needed for moderate pain.     aspirin EC 81 MG tablet Take 81 mg by mouth daily. Swallow whole.     clindamycin (CLEOCIN T) 1 % external solution Apply 1 Application topically as needed.     Docusate Sodium (COLACE PO) Take 1 tablet by  mouth every morning.     ipratropium (ATROVENT) 0.06 % nasal spray SMARTSIG:2 Spray(s) Both Nares Twice Daily PRN     sildenafil (VIAGRA) 25 MG tablet Take 1 tablet (25 mg total) by mouth as needed for erectile dysfunction. 10 tablet 6   No current facility-administered medications for this encounter.    No Known Allergies  Social History   Socioeconomic History   Marital status: Married    Spouse name: Not on file   Number of children: 3   Years of education: Not on file   Highest education level: Not on file  Occupational History   Not on file  Tobacco Use   Smoking status: Never   Smokeless tobacco: Never  Vaping Use   Vaping Use: Never used  Substance and Sexual Activity   Alcohol use: No   Drug use: No   Sexual activity: Not on file  Other Topics Concern   Not on file  Social History Narrative   Married.   3 children.   Works as a Physicist, medical and part Chief Technology Officer.    Enjoys spending time with family.    Social Determinants of Health   Financial Resource Strain: Not on file  Food Insecurity: Not on file  Transportation Needs: Not on file  Physical Activity: Not on file  Stress: Not on file  Social Connections: Not on file  Intimate Partner Violence: Not on file     ROS- All systems are reviewed and negative except as per the HPI above.  Physical Exam: Vitals:   11/15/22 1014  BP: 138/88  Pulse: (!) 58  Weight: 100.1 kg  Height: 6\' 1"  (1.854 m)    GEN- The patient is a well appearing male, alert and oriented x 3 today.   Head- normocephalic, atraumatic Eyes-  Sclera clear, conjunctiva pink Ears- hearing intact Oropharynx- clear Neck- supple  Lungs- Clear to ausculation bilaterally, normal work of breathing Heart- Regular bradycardic rate and rhythm, no murmurs, rubs or gallops  GI- soft, NT, ND, + BS Extremities- no clubbing, cyanosis, or edema MS- no significant deformity or atrophy Skin- no rash or lesion Psych- euthymic mood,  full affect Neuro- strength and sensation are intact  Wt Readings from Last 3 Encounters:  11/15/22 100.1 kg  01/15/22 98 kg  01/12/21 94.3 kg    EKG today demonstrates  Vent. rate 58 BPM PR interval 134 ms QRS duration 90 ms QT/QTcB 388/380 ms P-R-T axes 42 93 36 Sinus bradycardia Rightward axis Borderline ECG When compared with ECG of 15-Jun-2020 13:49, PREVIOUS ECG IS PRESENT  Echo 01/15/19 demonstrated:  1. The left ventricle has normal systolic function, with an ejection  fraction of 55-60%. The cavity size was normal. Left ventricular diastolic  parameters were normal.   2. The right ventricle has normal systolic function. The cavity was  normal.   3. The mitral valve is grossly normal.   4. The tricuspid valve is grossly normal.   5. The aortic valve is tricuspid. No stenosis of the aortic valve.   6. Normal LV function; no significant valvular disease.  Epic records are reviewed at length today.  Exercise stress test 02/12/2019: Blood pressure demonstrated a blunted response to exercise. ST segment elevation of 3 mm was noted during stress in the V1, aVR and aVL leads, beginning at 10 minutes of stress, and returning to baseline after 1-5 minutes of recovery. No symptoms reported, but diffuse ST depression (though upsloping) with ST elevation in multiple leads. Positive adequate stress test.  Coronary CTA 03/19/19: IMPRESSION: 1. Coronary artery calcium score 0 Agatston units. This suggests low risk for future cardiac events.   2.  No significant coronary disease noted.   3.  Small secundum ASD as described above.  CHA2DS2-VASc Score = 0  The patient's score is based upon: CHF History: 0 HTN History: 0 Diabetes History: 0 Stroke History: 0 Vascular Disease History: 0 Age Score: 0 Gender Score: 0       ASSESSMENT AND PLAN: Paroxysmal Atrial Fibrillation (ICD10:  I48.0) The patient's CHA2DS2-VASc score is 0, indicating a 0.2% annual risk of stroke.     He is in NSR today.  Education provided about Afib with visual diagram. Discussion about medication treatments and ablation in detail. After discussion, we will proceed with the following plan:  - Begin Multaq 400 mg BID. - He is in SR; no anticoagulation indicated. - Baseline Cmet. - Repeat ECG 1 week. - Schedule to speak with EP because he is interested in an ablation.  2. Obesity Body mass index is 29.1 kg/m. Lifestyle modification was discussed at length including regular exercise and weight reduction. Encouraged activity as tolerated.  3. Snoring with possible concern for Obstructive sleep apnea At this time, it does not appear that he has symptoms for OSA so we will defer any testing at this time.  Repeat ECG only in 1 week.   Lake Bells, PA-C Af[facility] [address] [place], [place] [address] [phone] 11/15/2022 10:30 AM

## 2022-11-15 NOTE — Patient Instructions (Signed)
Start Multaq 400mg twice a day WITH FOOD 

## 2022-11-22 ENCOUNTER — Ambulatory Visit (HOSPITAL_COMMUNITY)
Admission: RE | Admit: 2022-11-22 | Discharge: 2022-11-22 | Disposition: A | Discharge status: Home / Self Care | Payer: BC Managed Care – PPO | Source: Ambulatory Visit | Attending: Internal Medicine | Admitting: Internal Medicine

## 2022-11-22 VITALS — HR 58

## 2022-11-22 DIAGNOSIS — I480 Paroxysmal atrial fibrillation: Secondary | ICD-10-CM | POA: Insufficient documentation

## 2022-11-22 DIAGNOSIS — Z79899 Other long term (current) drug therapy: Secondary | ICD-10-CM | POA: Diagnosis not present

## 2022-11-22 DIAGNOSIS — R001 Bradycardia, unspecified: Secondary | ICD-10-CM | POA: Insufficient documentation

## 2022-11-22 NOTE — Progress Notes (Signed)
Patient returns for ECG after starting Multaq. ECG shows:  SB Vent. rate 58 BPM PR interval 142 ms QRS duration 92 ms QT/QTcB 418/410 ms  Patient thinks his afib has been less frequent since starting the medication. No adverse effects. F/u with Dr Nelly Laurence as scheduled to discuss ablation vs staying on Multaq.

## 2022-11-29 ENCOUNTER — Encounter: Payer: Self-pay | Admitting: Cardiovascular Disease

## 2022-12-03 ENCOUNTER — Other Ambulatory Visit (HOSPITAL_COMMUNITY): Payer: Self-pay | Admitting: *Deleted

## 2022-12-27 ENCOUNTER — Ambulatory Visit: Payer: BC Managed Care – PPO | Attending: Cardiovascular Disease | Admitting: Cardiovascular Disease

## 2022-12-27 ENCOUNTER — Encounter: Payer: Self-pay | Admitting: Cardiovascular Disease

## 2022-12-27 VITALS — BP 126/72 | HR 64 | Ht 73.0 in | Wt 218.6 lb

## 2022-12-27 DIAGNOSIS — I480 Paroxysmal atrial fibrillation: Secondary | ICD-10-CM

## 2022-12-27 MED ORDER — FLECAINIDE ACETATE 50 MG PO TABS: 50.0000 mg | ORAL_TABLET | Freq: Two times a day (BID) | ORAL | 3 refills | Status: DC

## 2022-12-27 MED ORDER — METOPROLOL TARTRATE 25 MG PO TABS: 12.5000 mg | ORAL_TABLET | Freq: Two times a day (BID) | ORAL | 3 refills | Status: DC

## 2022-12-27 NOTE — Patient Instructions (Addendum)
Medication Instructions:  START Metoprolol Tartrate 12.5 mg twice daily  START Flecainide 50 mg twice daily *If you need a refill on your cardiac medications before your next appointment, please call your pharmacy*  Testing/Procedures: Atrial Fibrillation Ablation  Your physician has recommended that you have an ablation. Catheter ablation is a medical procedure used to treat some cardiac arrhythmias (irregular heartbeats). During catheter ablation, a long, thin, flexible tube is put into a blood vessel in your groin (upper thigh), or neck. This tube is called an ablation catheter. It is then guided to your heart through the blood vessel. Radio frequency waves destroy small areas of heart tissue where abnormal heartbeats may cause an arrhythmia to start. Please see the instruction sheet given to you today.  You are scheduled for Atrial Fibrillation Ablation on Friday, December 20 with Dr. Halford Chessman.Please arrive at the Main Entrance A at [facility]: [address] [place], [place] [address] at 11:30 AM    Follow-Up: At [facility], you and your health needs are our priority.  As part of our continuing mission to provide you with exceptional heart care, we have created designated Provider Care Teams.  These Care Teams include your primary Cardiologist (physician) and Advanced Practice Providers (APPs -  Physician Assistants and Nurse Practitioners) who all work together to provide you with the care you need, when you need it.  We recommend signing up for the patient portal called "MyChart".  Sign up information is provided on this After Visit Summary.  MyChart is used to connect with patients for Virtual Visits (Telemedicine).  Patients are able to view lab/test results, encounter notes, upcoming appointments, etc.  Non-urgent messages can be sent to your provider as well.   To learn more about what you can do with MyChart, go to [email or web address].    Your next appointment:    01/06/23 - EKG nurse visit 06/03/23 - pre ablation appointment  Provider:   York Pellant, MD

## 2022-12-27 NOTE — Progress Notes (Signed)
Electrophysiology Office Note:    Date:  12/27/2022   ID:  Justin Carson, DOB 19-Jan-1971, MRN [id number]  PCP:  Charlane Ferretti, DO   [facility] Providers Cardiologist:  Bryan Lemma, MD     Referring MD: Eustace Pen, PA-C   History of Present Illness:    Justin Carson is a 52 y.o. male with a medical history significant for a small secundum ASD and paroxysmal atrial fibrillation, referred for atrial fibrillation management.     He was diagnosed with atrial fibrillation in May 2020.  He was symptomatic with atrial fibrillation.  A monitor showed intermittent RVR.  Decreasing caffeine helped.  Recently, he has been having increased frequency of A-fib episodes. He has taken ECG tracings with his Fitbit watch and documented episodes of AF. Rates are controlled.     He feels well today.  He was having some palpitations at the time of his EKG though it did not feel typical of his atrial fibrillation.  The EKG showed PACs and bigeminy.  EKGs/Labs/Other Studies Reviewed Today:    Echocardiogram:  TTE 01/15/2019 55-60%, normal structure and function   Monitors:  Zio 11/2018 - my interpretation Sinus rhythm HR 43-174, avg 65 4% AF burden -- associated with patient-triggered events; can't exclude flutter at times Rare PACs (<1%), but sometimes occurring in bigeminy and associated with palpitations  Stress testing:  02/2019 Exercised >28m. No ischemia -- my interpretation  Advanced imaging:   Cardiac catherization    EKG:   EKG Interpretation  Date/Time:  Friday December 27 2022 11:07:24 EDT Ventricular Rate:  64 PR Interval:  150 QRS Duration: 92 QT Interval:  404 QTC Calculation: 416 R Axis:   93 Text Interpretation: Sinus rhythm with Premature supraventricular complexes in a pattern of bigeminy Rightward axis Nonspecific ST abnormality When compared with ECG of 22-Nov-2022 13:38, Premature supraventricular complexes are now Present Confirmed by York Pellant [id number]) on 12/27/2022 11:15:48 AM     Physical Exam:    VS:  BP 126/72   Pulse 64   Ht 6\' 1"  (1.854 m)   Wt 218 lb 9.6 oz (99.2 kg)   SpO2 97%   BMI 28.84 kg/m     Wt Readings from Last 3 Encounters:  12/27/22 218 lb 9.6 oz (99.2 kg)  11/15/22 220 lb 9.6 oz (100.1 kg)  01/15/22 216 lb (98 kg)     GEN:  Well nourished, well developed in no acute distress CARDIAC: RRR, no murmurs, rubs, gallops RESPIRATORY:  Normal work of breathing MUSCULOSKELETAL: no edema    ASSESSMENT & PLAN:    Paroxysmal atrial fibrillation He did not tolerate dronedarone, and he had recurrence of A-fib on it He is symptomatic with palpitations and fatigue We discussed rhythm control options.  Having failed an antiarrhythmic drug, I recommend we schedule ablation. Will start flecainide 50 mg and metoprolol 12.5 mg twice daily until his ablation procedure.  I explained the risk of flecainide which have a low but nonzero risk of low heart rates and life-threatening arrhythmias. Normal stress test in 2020  We discussed the indication, rationale, logistics, anticipated benefits, and potential risks of the ablation procedure including but not limited to -- bleed at the groin access site, chest pain, damage to nearby organs such as the diaphragm, lungs, or esophagus, need for a drainage tube, or prolonged hospitalization. I explained that the risk for stroke, heart attack, need for open chest surgery, or even death is very low but not zero. he  expressed  understanding and wishes to proceed.  Frequent PACs On ECG today  Possible AF trigger Will map PACs if they are occurring at the time of ablation  Obesity BMI 29 He is progressing with weight loss -- I commended him on this  Snoring Wife has watched him during his sleep multiple times and never appreciated apnea episodes. His Fitbit has not detected desaturations   Signed, Maurice Small, MD  12/27/2022 11:23 AM    [facility]

## 2023-01-03 ENCOUNTER — Institutional Professional Consult (permissible substitution): Payer: BC Managed Care – PPO | Admitting: Cardiovascular Disease

## 2023-01-06 ENCOUNTER — Ambulatory Visit: Payer: BC Managed Care – PPO | Attending: Cardiovascular Disease | Admitting: Cardiovascular Disease

## 2023-01-06 VITALS — HR 53 | Ht 73.0 in | Wt 218.0 lb

## 2023-01-06 DIAGNOSIS — Z79899 Other long term (current) drug therapy: Secondary | ICD-10-CM | POA: Diagnosis not present

## 2023-01-06 DIAGNOSIS — I480 Paroxysmal atrial fibrillation: Secondary | ICD-10-CM | POA: Diagnosis not present

## 2023-01-06 NOTE — Patient Instructions (Signed)
Medication Instructions:  No changes *If you need a refill on your cardiac medications before your next appointment, please call your pharmacy*   Lab Work: none If you have labs (blood work) drawn today and your tests are completely normal, you will receive your results only by: MyChart Message (if you have MyChart) OR A paper copy in the mail If you have any lab test that is abnormal or we need to change your treatment, we will call you to review the results.   Testing/Procedures: none   Follow-Up: At [facility], you and your health needs are our priority.  As part of our continuing mission to provide you with exceptional heart care, we have created designated Provider Care Teams.  These Care Teams include your primary Cardiologist (physician) and Advanced Practice Providers (APPs -  Physician Assistants and Nurse Practitioners) who all work together to provide you with the care you need, when you need it.  We recommend signing up for the patient portal called "MyChart".  Sign up information is provided on this After Visit Summary.  MyChart is used to connect with patients for Virtual Visits (Telemedicine).  Patients are able to view lab/test results, encounter notes, upcoming appointments, etc.  Non-urgent messages can be sent to your provider as well.   To learn more about what you can do with MyChart, go to [email or web address].    Your next appointment:   As previously instructed

## 2023-01-06 NOTE — Progress Notes (Signed)
   Nurse Visit   Date of Encounter: 01/06/2023 ID: Justin Carson, DOB February 16, 1971, MRN [id number]  PCP:  Charlane Ferretti, DO   [facility] Providers Cardiologist:  Bryan Lemma, MD      Visit Details   VS:  Pulse (!) 53   Ht 6\' 1"  (1.854 m)   Wt 218 lb (98.9 kg)   BMI 28.76 kg/m  , BMI Body mass index is 28.76 kg/m.  Wt Readings from Last 3 Encounters:  01/06/23 218 lb (98.9 kg)  12/27/22 218 lb 9.6 oz (99.2 kg)  11/15/22 220 lb 9.6 oz (100.1 kg)     Reason for visit: EKG Performed today: EKG, Provider consulted:Dr Excell Seltzer, and Education Changes (medications, testing, etc.) : none Length of Visit: 15 minutes    Medications Adjustments/Labs and Tests Ordered: Orders Placed This Encounter  Procedures   EKG 12-Lead   No orders of the defined types were placed in this encounter.    Jeanie Cooks, LPN  4/0/9811 9:14 PM

## 2023-01-17 ENCOUNTER — Ambulatory Visit: Payer: BC Managed Care – PPO | Admitting: Physician Assistant

## 2023-02-05 ENCOUNTER — Ambulatory Visit
Admission: EM | Admit: 2023-02-05 | Discharge: 2023-02-05 | Disposition: A | Discharge status: Home / Self Care | Payer: BC Managed Care – PPO | Attending: Family Medicine | Admitting: Family Medicine

## 2023-02-05 DIAGNOSIS — H5789 Other specified disorders of eye and adnexa: Secondary | ICD-10-CM | POA: Diagnosis not present

## 2023-02-05 DIAGNOSIS — J019 Acute sinusitis, unspecified: Secondary | ICD-10-CM

## 2023-02-05 MED ORDER — AMOXICILLIN-POT CLAVULANATE 875-125 MG PO TABS: 1.0000 | ORAL_TABLET | Freq: Two times a day (BID) | ORAL | 0 refills | Status: AC

## 2023-02-05 MED ORDER — POLYMYXIN B-TRIMETHOPRIM 10000-0.1 UNIT/ML-% OP SOLN: 1.0000 [drp] | Freq: Three times a day (TID) | OPHTHALMIC | 0 refills | Status: AC | PRN

## 2023-02-05 NOTE — ED Triage Notes (Addendum)
Patient to Urgent Care with complaints of headaches/ facial pain that radiates into teeth/ sinus pain and pressure. Denies any recent fevers.   Also w/ complaints of left sided eye redness and irritation. Some drainage.  Symptoms started 10 days ago. Positive for Covid at that time, tested negative on Saturday.   Has been taking tylenol.

## 2023-02-05 NOTE — ED Provider Notes (Signed)
Justin Carson    CSN: [id number] Arrival date & time: 02/05/23  1723      History   Chief Complaint Chief Complaint  Patient presents with   Facial Pain   Headache    HPI Justin Carson is a 52 y.o. male.   HPI Patient here for evaluation of headache, facial and teeth pain. He is also experiencing a slight cough that is non-productive. He is also having left eye redness, that is non-draining, and non-itchy. Reports significant facial pressure which is causing his teeth to hurt. He has been using a nasal spray daily with temporal relief. He was diagnosed with COVID >10 days ago and symptoms resolved with the exception of nasal and sinus symptoms have progressed and worsened. Past Medical History:  Diagnosis Date   Complication of anesthesia    "laughing gas during wisdom teeth did not take affect per patient"   Hx of adenomatous colonic polyps 08/26/2019   PAF (paroxysmal atrial fibrillation) (HCC)    CHA2DS2Vasc = 0; Patient is in a-fib occasionally.  Takes ASA only.   Shingles     Patient Active Problem List   Diagnosis Date Noted   Erectile dysfunction due to arterial insufficiency 01/12/2021   Other problems related to employment 01/12/2021   Hx of adenomatous colonic polyps 08/26/2019   PAF (paroxysmal atrial fibrillation) (HCC) 01/04/2019   Atrial bigeminy 10/29/2018   Elevated AST (SGOT) 06/03/2018   Preventative health care 05/26/2017    Past Surgical History:  Procedure Laterality Date   CORONARY CT ANGIOGRAM  03/2019   Coronary calcium score 0.  No significant CAD.  Small secundum   EXERCISE TOLERANCE TEST  02/2019   Exercised for 13:26 min; 3 mm in V1 ST elevation noted at 10 minutes of stress.->  Referred for coronary CTA, no CAD noted.  Coronary calcium score 0   FOOT SURGERY     age 1    foot wound closure     at age 21   NASAL SEPTOPLASTY W/ TURBINOPLASTY Bilateral 06/19/2020   Procedure: NASAL SEPTOPLASTY WITH TURBINATE REDUCTION;  Surgeon:  Newman Pies, MD;  Location: [facility];  Service: ENT;  Laterality: Bilateral;   TRANSTHORACIC ECHOCARDIOGRAM  01/2019   Normal LV size and function.  EF 55 to 60%.  Normal valves.   WISDOM TOOTH EXTRACTION         Home Medications    Prior to Admission medications   Medication Sig Start Date End Date Taking? Authorizing Provider  amoxicillin-clavulanate (AUGMENTIN) 875-125 MG tablet Take 1 tablet by mouth every 12 (twelve) hours for 10 days. 02/05/23 02/15/23 Yes Bing Neighbors, NP  trimethoprim-polymyxin b (POLYTRIM) ophthalmic solution Place 1 drop into the left eye 3 (three) times daily as needed for up to 7 days. 02/05/23 02/12/23 Yes Bing Neighbors, NP  acetaminophen (TYLENOL) 500 MG tablet Take 1,000 mg by mouth every 6 (six) hours as needed for moderate pain.    [provider]  aspirin EC 81 MG tablet Take 81 mg by mouth daily. Swallow whole.    [provider]  clindamycin (CLEOCIN T) 1 % external solution Apply 1 Application topically as needed. 05/28/22   [provider]  Docusate Sodium (COLACE PO) Take 1 tablet by mouth every morning.    [provider]  flecainide (TAMBOCOR) 50 MG tablet Take 1 tablet (50 mg total) by mouth 2 (two) times daily. 12/27/22   Mealor, Roberts Gaudy, MD  ipratropium (ATROVENT) 0.06 % nasal  spray SMARTSIG:2 Spray(s) Both Nares Twice Daily PRN 09/27/22   [provider]  metoprolol tartrate (LOPRESSOR) 25 MG tablet Take 0.5 tablets (12.5 mg total) by mouth 2 (two) times daily. 12/27/22 03/27/23  Mealor, Roberts Gaudy, MD  sildenafil (VIAGRA) 25 MG tablet Take 1 tablet (25 mg total) by mouth as needed for erectile dysfunction. 06/28/22   [name], MD    Family History Family History  Problem Relation Age of Onset   Lung cancer Mother    COPD Mother    Throat cancer Mother    Hypertension Father    Liver disease Father    Colon cancer Neg Hx    Colon polyps Neg Hx    Esophageal cancer  Neg Hx    Stomach cancer Neg Hx    Rectal cancer Neg Hx     Social History Social History   Tobacco Use   Smoking status: Never   Smokeless tobacco: Never  Vaping Use   Vaping status: Never Used  Substance Use Topics   Alcohol use: No   Drug use: No     Allergies   Patient has no known allergies.   Review of Systems Review of Systems Pertinent negatives listed in HPI  Physical Exam Triage Vital Signs ED Triage Vitals  Encounter Vitals Group     BP 02/05/23 1738 121/79     Systolic BP Percentile --      Diastolic BP Percentile --      Pulse Rate 02/05/23 1738 62     Resp 02/05/23 1738 17     Temp 02/05/23 1738 (!) 97.5 F (36.4 C)     Temp src --      SpO2 02/05/23 1738 95 %     Weight --      Height --      Head Circumference --      Peak Flow --      Pain Score 02/05/23 1736 2     Pain Loc --      Pain Education --      Exclude from Growth Chart --    No data found.  Updated Vital Signs BP 121/79   Pulse 62   Temp (!) 97.5 F (36.4 C)   Resp 17   SpO2 95%   Visual Acuity Right Eye Distance:   Left Eye Distance:   Bilateral Distance:    Right Eye Near:   Left Eye Near:    Bilateral Near:     Physical Exam Vitals reviewed.  Constitutional:      Appearance: He is well-developed.  HENT:     Head: Normocephalic and atraumatic.     Right Ear: Tympanic membrane, ear canal and external ear normal.     Left Ear: Tympanic membrane, ear canal and external ear normal.  Eyes:     Extraocular Movements: Extraocular movements intact.     Pupils: Pupils are equal, round, and reactive to light.     Comments: Left eye redness and watery drainage present.. Conjunctiva appears normal   Cardiovascular:     Rate and Rhythm: Normal rate and regular rhythm.  Pulmonary:     Effort: Pulmonary effort is normal.     Breath sounds: Normal breath sounds.  Musculoskeletal:     Cervical back: Normal range of motion.  Lymphadenopathy:     Cervical: Cervical  adenopathy present.  Skin:    General: Skin is warm and dry.  Neurological:     General: No focal deficit  present.     Mental Status: He is alert.     UC Treatments / Results  Labs (all labs ordered are listed, but only abnormal results are displayed) Labs Reviewed - No data to display  EKG   Radiology No results found.  Procedures Procedures (including critical care time)  Medications Ordered in UC Medications - No data to display  Initial Impression / Assessment and Plan / UC Course  I have reviewed the triage vital signs and the nursing notes.  Pertinent labs & imaging results that were available during my care of the patient were reviewed by me and considered in my medical decision making (see chart for details).    Treating for sinusitis s/p COVID. For eye irritation, doesn't appear bacterial. Agreed to prescribed antibacterial eye drops if symptoms worsened. Encouraged patient to watch and wait or few day prior to use and to only administer if itching or drainage develops. Patient verbalized understanding and agreement with plan. Final Clinical Impressions(s) / UC Diagnoses   Final diagnoses:  Acute non-recurrent sinusitis, unspecified location  Irritation of left eye   Discharge Instructions   None    ED Prescriptions     Medication Sig Dispense Auth. Provider   trimethoprim-polymyxin b (POLYTRIM) ophthalmic solution Place 1 drop into the left eye 3 (three) times daily as needed for up to 7 days. 10 mL Bing Neighbors, NP   amoxicillin-clavulanate (AUGMENTIN) 875-125 MG tablet Take 1 tablet by mouth every 12 (twelve) hours for 10 days. 20 tablet Bing Neighbors, NP      PDMP not reviewed this encounter.   Bing Neighbors, NP 02/07/23 1843

## 2023-06-03 ENCOUNTER — Ambulatory Visit: Payer: BC Managed Care – PPO | Attending: Cardiovascular Disease | Admitting: Cardiovascular Disease

## 2023-06-03 ENCOUNTER — Encounter: Payer: Self-pay | Admitting: Cardiovascular Disease

## 2023-06-03 VITALS — BP 120/78 | HR 72 | Ht 73.0 in | Wt 226.8 lb

## 2023-06-03 DIAGNOSIS — I480 Paroxysmal atrial fibrillation: Secondary | ICD-10-CM

## 2023-06-03 MED ORDER — APIXABAN 5 MG PO TABS: 5.0000 mg | ORAL_TABLET | Freq: Two times a day (BID) | ORAL | 3 refills | Status: DC

## 2023-06-03 MED ORDER — APIXABAN 5 MG PO TABS: 5.0000 mg | ORAL_TABLET | Freq: Two times a day (BID) | ORAL | Status: DC

## 2023-06-03 NOTE — Progress Notes (Signed)
Electrophysiology Office Note:    Date:  06/03/2023   ID:  Justin Carson, DOB 1971-03-14, MRN [id number]  PCP:  Charlane Ferretti, DO   [facility] Providers Cardiologist:  Bryan Lemma, MD     Referring MD: Charlane Ferretti, DO   History of Present Illness:    Justin Carson is a 52 y.o. male with a medical history significant for a small secundum ASD and paroxysmal atrial fibrillation, referred for atrial fibrillation management.     He was diagnosed with atrial fibrillation in May 2020.  He was symptomatic with atrial fibrillation.  A monitor showed intermittent RVR.  Decreasing caffeine helped.  Recently, he has been having increased frequency of A-fib episodes. He has taken ECG tracings with his Fitbit watch and documented episodes of AF. Rates are controlled.     He feels well today.  He has been doing very well on flecainide and have recurrence of A-fib symptoms.  He was wondering whether it would be better continue flecainide or proceed with ablation.  EKGs/Labs/Other Studies Reviewed Today:    Echocardiogram:  TTE 01/15/2019 55-60%, normal structure and function   Monitors:  Zio 11/2018 - my interpretation Sinus rhythm HR 43-174, avg 65 4% AF burden -- associated with patient-triggered events; can't exclude flutter at times Rare PACs (<1%), but sometimes occurring in bigeminy and associated with palpitations  Stress testing:  02/2019 Exercised >26m. No ischemia -- my interpretation  Advanced imaging:   Cardiac catherization    EKG:   EKG Interpretation Date/Time:  Tuesday June 03 2023 09:45:21 EST Ventricular Rate:  72 PR Interval:  148 QRS Duration:  90 QT Interval:  368 QTC Calculation: 402 R Axis:   105  Text Interpretation: Normal sinus rhythm Rightward axis ST & T wave abnormality, consider inferior ischemia When compared with ECG of 06-Jan-2023 15:15, Vent. rate has increased BY  26 BPM Confirmed by York Pellant [id number]) on  06/03/2023 10:15:16 AM     Physical Exam:    VS:  BP 120/78 (BP Location: Left Arm, Patient Position: Sitting, Cuff Size: Large)   Pulse 72   Ht 6\' 1"  (1.854 m)   Wt 226 lb 12.8 oz (102.9 kg)   SpO2 95%   BMI 29.92 kg/m     Wt Readings from Last 3 Encounters:  06/03/23 226 lb 12.8 oz (102.9 kg)  01/06/23 218 lb (98.9 kg)  12/27/22 218 lb 9.6 oz (99.2 kg)     GEN:  Well nourished, well developed in no acute distress CARDIAC: RRR, no murmurs, rubs, gallops RESPIRATORY:  Normal work of breathing MUSCULOSKELETAL: no edema    ASSESSMENT & PLAN:    Paroxysmal atrial fibrillation He did not tolerate dronedarone, and he had recurrence of A-fib on it He is symptomatic with palpitations and fatigue We discussed rhythm control options.  Having failed an antiarrhythmic drug, I recommend we schedule ablation. Currently doing well on flecainide I recommended that he proceed with ablation and discontinue flecainide.  If he has recurrence of atrial fibrillation on flecainide, then he will again have to wait months for the ablation procedure.  Statistically, the ablation procedure has lower risk of better efficacy than entrapment drug.  We discussed the indication, rationale, logistics, anticipated benefits, and potential risks of the ablation procedure including but not limited to -- bleed at the groin access site, chest pain, need for a drainage tube, or prolonged hospitalization. I explained that the risk for stroke, heart attack, need for open chest surgery, or even death  is very low but not zero. he  expressed understanding and wishes to proceed.  Frequent PACs On ECG today  Possible AF trigger Will map PACs if they are occurring at the time of ablation  Obesity BMI 29 He is progressing with weight loss -- I commended him on this  Snoring Wife has watched him during his sleep multiple times and never appreciated apnea episodes. His Fitbit has not detected  desaturations   Signed, Maurice Small, MD  06/03/2023 10:28 AM    [facility]

## 2023-06-03 NOTE — H&P (View-Only) (Signed)
 Electrophysiology Office Note:    Date:  06/03/2023   ID:  Justin Carson, DOB 1971-03-14, MRN [id number]  PCP:  Charlane Ferretti, DO   [facility] Providers Cardiologist:  Bryan Lemma, MD     Referring MD: Charlane Ferretti, DO   History of Present Illness:    Justin Carson is a 52 y.o. male with a medical history significant for a small secundum ASD and paroxysmal atrial fibrillation, referred for atrial fibrillation management.     He was diagnosed with atrial fibrillation in May 2020.  He was symptomatic with atrial fibrillation.  A monitor showed intermittent RVR.  Decreasing caffeine helped.  Recently, he has been having increased frequency of A-fib episodes. He has taken ECG tracings with his Fitbit watch and documented episodes of AF. Rates are controlled.     He feels well today.  He has been doing very well on flecainide and have recurrence of A-fib symptoms.  He was wondering whether it would be better continue flecainide or proceed with ablation.  EKGs/Labs/Other Studies Reviewed Today:    Echocardiogram:  TTE 01/15/2019 55-60%, normal structure and function   Monitors:  Zio 11/2018 - my interpretation Sinus rhythm HR 43-174, avg 65 4% AF burden -- associated with patient-triggered events; can't exclude flutter at times Rare PACs (<1%), but sometimes occurring in bigeminy and associated with palpitations  Stress testing:  02/2019 Exercised >26m. No ischemia -- my interpretation  Advanced imaging:   Cardiac catherization    EKG:   EKG Interpretation Date/Time:  Tuesday June 03 2023 09:45:21 EST Ventricular Rate:  72 PR Interval:  148 QRS Duration:  90 QT Interval:  368 QTC Calculation: 402 R Axis:   105  Text Interpretation: Normal sinus rhythm Rightward axis ST & T wave abnormality, consider inferior ischemia When compared with ECG of 06-Jan-2023 15:15, Vent. rate has increased BY  26 BPM Confirmed by York Pellant [id number]) on  06/03/2023 10:15:16 AM     Physical Exam:    VS:  BP 120/78 (BP Location: Left Arm, Patient Position: Sitting, Cuff Size: Large)   Pulse 72   Ht 6\' 1"  (1.854 m)   Wt 226 lb 12.8 oz (102.9 kg)   SpO2 95%   BMI 29.92 kg/m     Wt Readings from Last 3 Encounters:  06/03/23 226 lb 12.8 oz (102.9 kg)  01/06/23 218 lb (98.9 kg)  12/27/22 218 lb 9.6 oz (99.2 kg)     GEN:  Well nourished, well developed in no acute distress CARDIAC: RRR, no murmurs, rubs, gallops RESPIRATORY:  Normal work of breathing MUSCULOSKELETAL: no edema    ASSESSMENT & PLAN:    Paroxysmal atrial fibrillation He did not tolerate dronedarone, and he had recurrence of A-fib on it He is symptomatic with palpitations and fatigue We discussed rhythm control options.  Having failed an antiarrhythmic drug, I recommend we schedule ablation. Currently doing well on flecainide I recommended that he proceed with ablation and discontinue flecainide.  If he has recurrence of atrial fibrillation on flecainide, then he will again have to wait months for the ablation procedure.  Statistically, the ablation procedure has lower risk of better efficacy than entrapment drug.  We discussed the indication, rationale, logistics, anticipated benefits, and potential risks of the ablation procedure including but not limited to -- bleed at the groin access site, chest pain, need for a drainage tube, or prolonged hospitalization. I explained that the risk for stroke, heart attack, need for open chest surgery, or even death  is very low but not zero. he  expressed understanding and wishes to proceed.  Frequent PACs On ECG today  Possible AF trigger Will map PACs if they are occurring at the time of ablation  Obesity BMI 29 He is progressing with weight loss -- I commended him on this  Snoring Wife has watched him during his sleep multiple times and never appreciated apnea episodes. His Fitbit has not detected  desaturations   Signed, Maurice Small, MD  06/03/2023 10:28 AM    [facility]

## 2023-06-03 NOTE — Addendum Note (Signed)
Addended by: Sherle Poe R on: 06/03/2023 10:36 AM   Modules accepted: Orders

## 2023-06-03 NOTE — Patient Instructions (Addendum)
Medication Instructions:  START Eliquis 5 mg twice daily  *If you need a refill on your cardiac medications before your next appointment, please call your pharmacy*   Lab Work: CBC and BMET today  If you have labs (blood work) drawn today and your tests are completely normal, you will receive your results only by: MyChart Message (if you have MyChart) OR A paper copy in the mail If you have any lab test that is abnormal or we need to change your treatment, we will call you to review the results.   Testing/Procedures: Atrial Fibrillation Ablation - see instruction letter Your physician has recommended that you have an ablation. Catheter ablation is a medical procedure used to treat some cardiac arrhythmias (irregular heartbeats). During catheter ablation, a long, thin, flexible tube is put into a blood vessel in your groin (upper thigh), or neck. This tube is called an ablation catheter. It is then guided to your heart through the blood vessel. Radio frequency waves destroy small areas of heart tissue where abnormal heartbeats may cause an arrhythmia to start. Please see the instruction sheet given to you today.   Follow-Up: At [facility], you and your health needs are our priority.  As part of our continuing mission to provide you with exceptional heart care, we have created designated Provider Care Teams.  These Care Teams include your primary Cardiologist (physician) and Advanced Practice Providers (APPs -  Physician Assistants and Nurse Practitioners) who all work together to provide you with the care you need, when you need it.  We recommend signing up for the patient portal called "MyChart".  Sign up information is provided on this After Visit Summary.  MyChart is used to connect with patients for Virtual Visits (Telemedicine).  Patients are able to view lab/test results, encounter notes, upcoming appointments, etc.  Non-urgent messages can be sent to your provider as well.   To  learn more about what you can do with MyChart, go to [email or web address].    Your next appointment:   We will schedule follow up after ablation  Provider:   York Pellant, MD

## 2023-06-04 LAB — BASIC METABOLIC PANEL
BUN/Creatinine Ratio: 7 — ABNORMAL LOW (ref 9–20)
BUN: 9 mg/dL (ref 6–24)
CO2: 28 mmol/L (ref 20–29)
Calcium: 9.7 mg/dL (ref 8.7–10.2)
Chloride: 103 mmol/L (ref 96–106)
Creatinine, Ser: 1.31 mg/dL — ABNORMAL HIGH (ref 0.76–1.27)
Glucose: 85 mg/dL (ref 70–99)
Potassium: 4.5 mmol/L (ref 3.5–5.2)
Sodium: 141 mmol/L (ref 134–144)
eGFR: 65 mL/min/{1.73_m2} (ref >59)

## 2023-06-04 LAB — CBC
Hematocrit: 48 % (ref 37.5–51.0)
Hemoglobin: 16.3 g/dL (ref 13.0–17.7)
MCH: 30.4 pg (ref 26.6–33.0)
MCHC: 34 g/dL (ref 31.5–35.7)
MCV: 89 fL (ref 79–97)
Platelets: 219 10*3/uL (ref 150–450)
RBC: 5.37 x10E6/uL (ref 4.14–5.80)
RDW: 12.6 % (ref 11.6–15.4)
WBC: 6.1 10*3/uL (ref 3.4–10.8)

## 2023-06-20 ENCOUNTER — Ambulatory Visit (HOSPITAL_COMMUNITY)
Admission: RE | Admit: 2023-06-20 | Discharge: 2023-06-20 | Disposition: A | Discharge status: Home / Self Care | Payer: BC Managed Care – PPO | Source: Ambulatory Visit | Attending: Cardiovascular Disease | Admitting: Cardiovascular Disease

## 2023-06-20 DIAGNOSIS — I480 Paroxysmal atrial fibrillation: Secondary | ICD-10-CM | POA: Insufficient documentation

## 2023-06-20 MED ORDER — IOHEXOL 350 MG/ML SOLN
100.0000 mL | Freq: Once | INTRAVENOUS | Status: AC | PRN
  Administered 2023-06-20: 100 mL via INTRAVENOUS

## 2023-06-26 NOTE — Pre-Procedure Instructions (Signed)
Instructed patient on the following items: Arrival time 1000 Nothing to eat or drink after midnight No meds AM of procedure Responsible person to drive you home and stay with you for 24 hrs  Have you missed any doses of anti-coagulant Eliquis- takes twice a day, hasn't missed any doses.  Don't take dose in the morning.

## 2023-06-27 ENCOUNTER — Ambulatory Visit (HOSPITAL_COMMUNITY): Payer: BC Managed Care – PPO | Admitting: Anesthesiology

## 2023-06-27 ENCOUNTER — Other Ambulatory Visit: Payer: Self-pay

## 2023-06-27 ENCOUNTER — Encounter (HOSPITAL_COMMUNITY)
Admission: RE | Disposition: A | Discharge status: Home / Self Care | Payer: BC Managed Care – PPO | Source: Home / Self Care | Attending: Cardiovascular Disease

## 2023-06-27 ENCOUNTER — Ambulatory Visit (HOSPITAL_COMMUNITY)
Admission: RE | Admit: 2023-06-27 | Discharge: 2023-06-27 | Disposition: A | Discharge status: Home / Self Care | Payer: BC Managed Care – PPO | Attending: Cardiovascular Disease | Admitting: Cardiovascular Disease

## 2023-06-27 DIAGNOSIS — R0683 Snoring: Secondary | ICD-10-CM | POA: Insufficient documentation

## 2023-06-27 DIAGNOSIS — I480 Paroxysmal atrial fibrillation: Secondary | ICD-10-CM | POA: Insufficient documentation

## 2023-06-27 DIAGNOSIS — Z6829 Body mass index (BMI) 29.0-29.9, adult: Secondary | ICD-10-CM | POA: Insufficient documentation

## 2023-06-27 DIAGNOSIS — Z7901 Long term (current) use of anticoagulants: Secondary | ICD-10-CM | POA: Insufficient documentation

## 2023-06-27 DIAGNOSIS — E669 Obesity, unspecified: Secondary | ICD-10-CM | POA: Diagnosis not present

## 2023-06-27 HISTORY — PX: ATRIAL FIBRILLATION ABLATION: EP1191

## 2023-06-27 LAB — POCT ACTIVATED CLOTTING TIME: Activated Clotting Time: 331 s

## 2023-06-27 SURGERY — ATRIAL FIBRILLATION ABLATION
Anesthesia: General

## 2023-06-27 MED ORDER — PROTAMINE SULFATE 10 MG/ML IV SOLN
INTRAVENOUS | Status: DC | PRN
  Administered 2023-06-27: 50 mg via INTRAVENOUS

## 2023-06-27 MED ORDER — HEPARIN SODIUM (PORCINE) 1000 UNIT/ML IJ SOLN
INTRAMUSCULAR | Status: DC | PRN
  Administered 2023-06-27: 16000 [IU] via INTRAVENOUS

## 2023-06-27 MED ORDER — MIDAZOLAM HCL 2 MG/2ML IJ SOLN
INTRAMUSCULAR | Status: AC
  Filled 2023-06-27: qty 2

## 2023-06-27 MED ORDER — HEPARIN (PORCINE) IN NACL 1000-0.9 UT/500ML-% IV SOLN
INTRAVENOUS | Status: DC | PRN
  Administered 2023-06-27 (×3): 500 mL

## 2023-06-27 MED ORDER — MIDAZOLAM HCL 2 MG/2ML IJ SOLN
INTRAMUSCULAR | Status: DC | PRN
  Administered 2023-06-27: 2 mg via INTRAVENOUS

## 2023-06-27 MED ORDER — ACETAMINOPHEN 325 MG PO TABS: 650.0000 mg | ORAL_TABLET | ORAL | Status: DC | PRN

## 2023-06-27 MED ORDER — SUGAMMADEX SODIUM 200 MG/2ML IV SOLN
INTRAVENOUS | Status: DC | PRN
  Administered 2023-06-27: 200 mg via INTRAVENOUS

## 2023-06-27 MED ORDER — ATROPINE SULFATE 1 MG/ML IV SOLN
INTRAVENOUS | Status: DC | PRN
  Administered 2023-06-27: 1 mg via INTRAVENOUS

## 2023-06-27 MED ORDER — PHENYLEPHRINE HCL (PRESSORS) 10 MG/ML IV SOLN: INTRAVENOUS | Status: DC | PRN

## 2023-06-27 MED ORDER — ROCURONIUM BROMIDE 10 MG/ML (PF) SYRINGE
PREFILLED_SYRINGE | INTRAVENOUS | Status: DC | PRN
  Administered 2023-06-27: 10 mg via INTRAVENOUS
  Administered 2023-06-27: 60 mg via INTRAVENOUS

## 2023-06-27 MED ORDER — LIDOCAINE 2% (20 MG/ML) 5 ML SYRINGE
INTRAMUSCULAR | Status: DC | PRN
  Administered 2023-06-27: 60 mg via INTRAVENOUS

## 2023-06-27 MED ORDER — SODIUM CHLORIDE 0.9 % IV SOLN: INTRAVENOUS | Status: DC

## 2023-06-27 MED ORDER — FENTANYL CITRATE (PF) 100 MCG/2ML IJ SOLN
INTRAMUSCULAR | Status: AC
  Filled 2023-06-27: qty 2

## 2023-06-27 MED ORDER — DEXAMETHASONE SODIUM PHOSPHATE 10 MG/ML IJ SOLN
INTRAMUSCULAR | Status: DC | PRN
  Administered 2023-06-27: 10 mg via INTRAVENOUS

## 2023-06-27 MED ORDER — PROPOFOL 10 MG/ML IV BOLUS
INTRAVENOUS | Status: DC | PRN
  Administered 2023-06-27: 200 mg via INTRAVENOUS

## 2023-06-27 MED ORDER — ONDANSETRON HCL 4 MG/2ML IJ SOLN: 4.0000 mg | Freq: Four times a day (QID) | INTRAMUSCULAR | Status: DC | PRN

## 2023-06-27 MED ORDER — ONDANSETRON HCL 4 MG/2ML IJ SOLN
INTRAMUSCULAR | Status: DC | PRN
  Administered 2023-06-27: 4 mg via INTRAVENOUS

## 2023-06-27 MED ORDER — FENTANYL CITRATE (PF) 100 MCG/2ML IJ SOLN
INTRAMUSCULAR | Status: DC | PRN
  Administered 2023-06-27: 100 ug via INTRAVENOUS

## 2023-06-27 MED ORDER — ALBUMIN HUMAN 5 % IV SOLN: INTRAVENOUS | Status: DC | PRN

## 2023-06-27 MED ORDER — SODIUM CHLORIDE 0.9 % IV SOLN: 250.0000 mL | INTRAVENOUS | Status: DC | PRN

## 2023-06-27 MED ORDER — SODIUM CHLORIDE 0.9% FLUSH: 3.0000 mL | INTRAVENOUS | Status: DC | PRN

## 2023-06-27 MED ORDER — PHENYLEPHRINE 80 MCG/ML (10ML) SYRINGE FOR IV PUSH (FOR BLOOD PRESSURE SUPPORT)
PREFILLED_SYRINGE | INTRAVENOUS | Status: DC | PRN
  Administered 2023-06-27 (×8): 80 ug via INTRAVENOUS
  Administered 2023-06-27: 160 ug via INTRAVENOUS

## 2023-06-27 SURGICAL SUPPLY — 22 items
BAG SNAP BAND KOVER 36X36 (MISCELLANEOUS) IMPLANT
CABLE PFA RX CATH CONN (CABLE) IMPLANT
CATH 8FR REPROCESSED SOUNDSTAR (CATHETERS) ×1
CATH 8FR SOUNDSTAR REPROCESSED (CATHETERS) IMPLANT
CATH FARAWAVE ABLATION 31 (CATHETERS) IMPLANT
CATH OCTARAY 2.0 F 3-3-3-3-3 (CATHETERS) IMPLANT
CATH WEB BI DIR CSDF CRV REPRO (CATHETERS) IMPLANT
CLOSURE PERCLOSE PROSTYLE (VASCULAR PRODUCTS) IMPLANT
COVER SWIFTLINK CONNECTOR (BAG) ×2 IMPLANT
DEVICE CLOSURE MYNXGRIP 6/7F (Vascular Products) IMPLANT
DILATOR VESSEL 38 20CM 16FR (INTRODUCER) IMPLANT
GUIDEWIRE INQWIRE 1.5J.035X260 (WIRE) IMPLANT
INQWIRE 1.5J .035X260CM (WIRE) ×1
KIT VERSACROSS CNCT FARADRIVE (KITS) IMPLANT
MAT PREVALON FULL STRYKER (MISCELLANEOUS) IMPLANT
PACK EP LF (CUSTOM PROCEDURE TRAY) ×2 IMPLANT
PAD DEFIB RADIO PHYSIO CONN (PAD) ×2 IMPLANT
PATCH CARTO3 (PAD) IMPLANT
SHEATH FARADRIVE STEERABLE (SHEATH) IMPLANT
SHEATH PINNACLE 8F 10CM (SHEATH) IMPLANT
SHEATH PINNACLE 9F 10CM (SHEATH) IMPLANT
SHEATH PROBE COVER 6X72 (BAG) IMPLANT

## 2023-06-27 NOTE — Anesthesia Preprocedure Evaluation (Addendum)
Anesthesia Evaluation  Patient identified by MRN, date of birth, ID band Patient awake    Reviewed: Allergy & Precautions, NPO status , Patient's Chart, lab work & pertinent test results  Airway Mallampati: III  TM Distance: >3 FB Neck ROM: Full    Dental no notable dental hx.    Pulmonary neg pulmonary ROS   Pulmonary exam normal        Cardiovascular Normal cardiovascular exam+ dysrhythmias Atrial Fibrillation  Rate:Normal     Neuro/Psych negative neurological ROS  negative psych ROS   GI/Hepatic negative GI ROS, Neg liver ROS,,,  Endo/Other  negative endocrine ROS    Renal/GU negative Renal ROS     Musculoskeletal negative musculoskeletal ROS (+)    Abdominal   Peds  Hematology  (+) Blood dyscrasia (Eliquis)   Anesthesia Other Findings A-fib  Reproductive/Obstetrics                             Anesthesia Physical Anesthesia Plan  ASA: 3  Anesthesia Plan: General   Post-op Pain Management:    Induction: Intravenous  PONV Risk Score and Plan: 2 and Ondansetron, Dexamethasone, Midazolam and Treatment may vary due to age or medical condition  Airway Management Planned: Oral ETT  Additional Equipment:   Intra-op Plan:   Post-operative Plan: Extubation in OR  Informed Consent: I have reviewed the patients History and Physical, chart, labs and discussed the procedure including the risks, benefits and alternatives for the proposed anesthesia with the patient or authorized representative who has indicated his/her understanding and acceptance.     Dental advisory given  Plan Discussed with: CRNA  Anesthesia Plan Comments:        Anesthesia Quick Evaluation

## 2023-06-27 NOTE — Anesthesia Procedure Notes (Signed)
Procedure Name: Intubation Date/Time: 06/27/2023 2:30 PM  Performed by: Leonides Grills, MDPre-anesthesia Checklist: Patient identified, Emergency Drugs available, Suction available and Patient being monitored Patient Re-evaluated:Patient Re-evaluated prior to induction Oxygen Delivery Method: Circle system utilized Preoxygenation: Pre-oxygenation with 100% oxygen Induction Type: IV induction Ventilation: Mask ventilation without difficulty Laryngoscope Size: Miller and 2 Grade View: Grade I Tube type: Oral Tube size: 8.0 mm Number of attempts: 2 Airway Equipment and Method: Stylet and Oral airway Placement Confirmation: ETT inserted through vocal cords under direct vision, positive ETCO2 and breath sounds checked- equal and bilateral Secured at: 23 cm Tube secured with: Tape Dental Injury: Teeth and Oropharynx as per pre-operative assessment  Comments: Unable to visual vocal cords on the first attempt with a Miller 3

## 2023-06-27 NOTE — Transfer of Care (Signed)
Immediate Anesthesia Transfer of Care Note  Patient: Justin Carson  Procedure(s) Performed: ATRIAL FIBRILLATION ABLATION  Patient Location: PACU  Anesthesia Type:General  Level of Consciousness: awake  Airway & Oxygen Therapy: Patient Spontanous Breathing  Post-op Assessment: Report given to RN  Post vital signs: Reviewed  Last Vitals:  Vitals Value Taken Time  BP    Temp    Pulse    Resp    SpO2      Last Pain:  Vitals:   06/27/23 1029  TempSrc: Oral  PainSc: 0-No pain         Complications: There were no known notable events for this encounter.

## 2023-06-27 NOTE — Progress Notes (Signed)
Patient walked to the bathroom without difficulties. Bilateral groins level 0, clean, dry, and intact.

## 2023-06-27 NOTE — Anesthesia Postprocedure Evaluation (Signed)
Anesthesia Post Note  Patient: Justin Carson  Procedure(s) Performed: ATRIAL FIBRILLATION ABLATION     Patient location during evaluation: PACU Anesthesia Type: General Level of consciousness: awake Pain management: pain level controlled Vital Signs Assessment: post-procedure vital signs reviewed and stable Respiratory status: spontaneous breathing, nonlabored ventilation and respiratory function stable Cardiovascular status: blood pressure returned to baseline and stable Postop Assessment: no apparent nausea or vomiting Anesthetic complications: no   There were no known notable events for this encounter.  Last Vitals:  Vitals:   06/27/23 1700 06/27/23 1800  BP: 110/72 108/62  Pulse: 70 66  Resp: 12 13  Temp:    SpO2: 98% 98%    Last Pain:  Vitals:   06/27/23 1645  TempSrc:   PainSc: 0-No pain                 Keirra Zeimet P Alliyah Roesler

## 2023-06-27 NOTE — Interval H&P Note (Signed)
History and Physical Interval Note:  06/27/2023 1:42 PM  Justin Carson  has presented today for surgery, with the diagnosis of afib.  The various methods of treatment have been discussed with the patient and family. After consideration of risks, benefits and other options for treatment, the patient has consented to  Procedure(s): ATRIAL FIBRILLATION ABLATION (N/A) as a surgical intervention.  The patient's history has been reviewed, patient examined, no change in status, stable for surgery.  I have reviewed the patient's chart and labs.  Questions were answered to the patient's satisfaction.    I reviewed the patient's CT and labs. There was no LAA thrombus. he  has not missed any doses of anticoagulation, and he took his dose last night. There have been no changes in the patient's diagnoses, medications, or condition since our recent clinic visit.  Roberts Gaudy Darianna Amy

## 2023-06-27 NOTE — Discharge Instructions (Signed)

## 2023-06-30 ENCOUNTER — Telehealth: Payer: Self-pay | Admitting: Internal Medicine

## 2023-06-30 ENCOUNTER — Encounter (HOSPITAL_COMMUNITY): Payer: Self-pay | Admitting: Cardiovascular Disease

## 2023-06-30 NOTE — Telephone Encounter (Signed)
Called by patient. He had AF ablation on 12/20. He had big BM yesterday and noticed a walnut sized knot in his groin today. He has had no bleeding. Per op report, he had only venous access. I advised to monitor and if it grows or bleeds to come in tonight but either way call Dr. Nelly Laurence to see if he wants to evaluate him tomorrow. He agreed to plan.

## 2023-07-01 ENCOUNTER — Encounter (HOSPITAL_COMMUNITY): Payer: Self-pay

## 2023-07-01 ENCOUNTER — Other Ambulatory Visit: Payer: Self-pay

## 2023-07-01 ENCOUNTER — Emergency Department (HOSPITAL_COMMUNITY)
Admission: EM | Admit: 2023-07-01 | Discharge: 2023-07-01 | Disposition: A | Discharge status: Home / Self Care | Payer: BC Managed Care – PPO | Attending: Emergency Medicine | Admitting: Emergency Medicine

## 2023-07-01 ENCOUNTER — Emergency Department (HOSPITAL_BASED_OUTPATIENT_CLINIC_OR_DEPARTMENT_OTHER): Payer: BC Managed Care – PPO

## 2023-07-01 DIAGNOSIS — I4891 Unspecified atrial fibrillation: Secondary | ICD-10-CM | POA: Insufficient documentation

## 2023-07-01 DIAGNOSIS — S3991XA Unspecified injury of abdomen, initial encounter: Secondary | ICD-10-CM | POA: Diagnosis present

## 2023-07-01 DIAGNOSIS — X58XXXA Exposure to other specified factors, initial encounter: Secondary | ICD-10-CM | POA: Insufficient documentation

## 2023-07-01 DIAGNOSIS — Z79899 Other long term (current) drug therapy: Secondary | ICD-10-CM | POA: Diagnosis not present

## 2023-07-01 DIAGNOSIS — S301XXA Contusion of abdominal wall, initial encounter: Secondary | ICD-10-CM | POA: Diagnosis not present

## 2023-07-01 DIAGNOSIS — Z7901 Long term (current) use of anticoagulants: Secondary | ICD-10-CM | POA: Diagnosis not present

## 2023-07-01 DIAGNOSIS — I739 Peripheral vascular disease, unspecified: Secondary | ICD-10-CM

## 2023-07-01 DIAGNOSIS — G8918 Other acute postprocedural pain: Secondary | ICD-10-CM

## 2023-07-01 DIAGNOSIS — I729 Aneurysm of unspecified site: Secondary | ICD-10-CM

## 2023-07-01 LAB — CBC WITH DIFFERENTIAL/PLATELET
Abs Immature Granulocytes: 0.04 10*3/uL (ref 0.00–0.07)
Basophils Absolute: 0.1 10*3/uL (ref 0.0–0.1)
Basophils Relative: 1 %
Eosinophils Absolute: 0.2 10*3/uL (ref 0.0–0.5)
Eosinophils Relative: 2 %
HCT: 45.2 % (ref 39.0–52.0)
Hemoglobin: 15.5 g/dL (ref 13.0–17.0)
Immature Granulocytes: 0 %
Lymphocytes Relative: 29 %
Lymphs Abs: 2.7 10*3/uL (ref 0.7–4.0)
MCH: 29.3 pg (ref 26.0–34.0)
MCHC: 34.3 g/dL (ref 30.0–36.0)
MCV: 85.4 fL (ref 80.0–100.0)
Monocytes Absolute: 0.9 10*3/uL (ref 0.1–1.0)
Monocytes Relative: 10 %
Neutro Abs: 5.5 10*3/uL (ref 1.7–7.7)
Neutrophils Relative %: 58 %
Platelets: 209 10*3/uL (ref 150–400)
RBC: 5.29 MIL/uL (ref 4.22–5.81)
RDW: 11.9 % (ref 11.5–15.5)
WBC: 9.4 10*3/uL (ref 4.0–10.5)
nRBC: 0 % (ref 0.0–0.2)

## 2023-07-01 LAB — BASIC METABOLIC PANEL
Anion gap: 9 (ref 5–15)
BUN: 16 mg/dL (ref 6–20)
CO2: 26 mmol/L (ref 22–32)
Calcium: 9.3 mg/dL (ref 8.9–10.3)
Chloride: 104 mmol/L (ref 98–111)
Creatinine, Ser: 1.4 mg/dL — ABNORMAL HIGH (ref 0.61–1.24)
GFR, Estimated: 60 mL/min — ABNORMAL LOW (ref >60)
Glucose, Bld: 111 mg/dL — ABNORMAL HIGH (ref 70–99)
Potassium: 4 mmol/L (ref 3.5–5.1)
Sodium: 139 mmol/L (ref 135–145)

## 2023-07-01 NOTE — Progress Notes (Signed)
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 Flow to hematoma could not be visualized after holding pressure.

## 2023-07-01 NOTE — ED Provider Notes (Signed)
Care of the patient assumed at signout.  I have discussed findings with him and his wife on 2 occasions, and conferred with our cardiology and vascular surgery team.  Given concern for pseudoaneurysm, patient will hold anticoagulation, will follow-up with her vascular surgery clinic, as previously scheduled in the coming days for repeat ultrasound and evaluation.  Patient, wife, comfortable with plan.   Gerhard Munch, MD 07/01/23 1426

## 2023-07-01 NOTE — ED Provider Notes (Signed)
[place] EM[facility]ENC[facility] DE[facility] AT [facility] Provider Note   CSN: [id number] Arrival date & time: 07/01/23  0441     History  Chief Complaint  Patient presents with   Post-op Problem    Justin Carson is a 52 y.o. male.  Patient presents to the emergency department with expanding swelling in the left groin.  Patient reports that he had atrial fibrillation ablation performed on December 20.  Patient reports both femoral arteries were accessed.  Everything on the right has been fine, he started having swelling after straining to have a bowel movement and over the last 24 hours that has continued to enlarge.       Home Medications Prior to Admission medications   Medication Sig Start Date End Date Taking? Authorizing Provider  acetaminophen (TYLENOL) 500 MG tablet Take 1,000 mg by mouth every 6 (six) hours as needed for moderate pain.    [provider]  apixaban (ELIQUIS) 5 MG TABS tablet Take 1 tablet (5 mg total) by mouth 2 (two) times daily. 06/03/23   Mealor, Roberts Gaudy, MD  docusate sodium (COLACE) 100 MG capsule Take 100 mg by mouth daily as needed for mild constipation.    [provider]  flecainide (TAMBOCOR) 50 MG tablet Take 1 tablet (50 mg total) by mouth 2 (two) times daily. 12/27/22   Mealor, Roberts Gaudy, MD  ipratropium (ATROVENT) 0.06 % nasal spray Place 2 sprays into both nostrils 2 (two) times daily. 09/27/22   [provider]  metoprolol tartrate (LOPRESSOR) 25 MG tablet Take 0.5 tablets (12.5 mg total) by mouth 2 (two) times daily. 12/27/22 06/27/23  Mealor, Roberts Gaudy, MD  sildenafil (VIAGRA) 50 MG tablet Take 50 mg by mouth daily as needed for erectile dysfunction.    [provider]      Allergies    Patient has no known allergies.    Review of Systems   Review of Systems  Physical Exam Updated Vital Signs BP 119/81   Pulse (!) 56   Temp 98.1 F (36.7 C) (Oral)   Resp 18   Ht 6\' 1"  (1.854 m)   Wt 97.5  kg   SpO2 100%   BMI 28.37 kg/m  Physical Exam Vitals and nursing note reviewed.  Constitutional:      General: He is not in acute distress.    Appearance: He is well-developed.  HENT:     Head: Normocephalic and atraumatic.     Mouth/Throat:     Mouth: Mucous membranes are moist.  Eyes:     General: Vision grossly intact. Gaze aligned appropriately.     Extraocular Movements: Extraocular movements intact.     Conjunctiva/sclera: Conjunctivae normal.  Cardiovascular:     Rate and Rhythm: Normal rate and regular rhythm.     Pulses: Normal pulses.     Heart sounds: Normal heart sounds, S1 normal and S2 normal. No murmur heard.    No friction rub. No gallop.  Pulmonary:     Effort: Pulmonary effort is normal. No respiratory distress.     Breath sounds: Normal breath sounds.  Abdominal:     Palpations: Abdomen is soft.     Tenderness: There is no abdominal tenderness. There is no guarding or rebound.     Hernia: No hernia is present.  Musculoskeletal:        General: No swelling.     Cervical back: Full passive range of motion without pain, normal range of motion and neck supple. No pain  with movement, spinous process tenderness or muscular tenderness. Normal range of motion.     Right lower leg: No edema.     Left lower leg: No edema.       Legs:  Skin:    General: Skin is warm and dry.     Capillary Refill: Capillary refill takes less than 2 seconds.     Findings: Bruising (Left groin) present. No ecchymosis, erythema, lesion or wound.  Neurological:     Mental Status: He is alert and oriented to person, place, and time.     GCS: GCS eye subscore is 4. GCS verbal subscore is 5. GCS motor subscore is 6.     Cranial Nerves: Cranial nerves 2-12 are intact.     Sensory: Sensation is intact.     Motor: Motor function is intact. No weakness or abnormal muscle tone.     Coordination: Coordination is intact.  Psychiatric:        Mood and Affect: Mood normal.        Speech:  Speech normal.        Behavior: Behavior normal.     ED Results / Procedures / Treatments   Labs (all labs ordered are listed, but only abnormal results are displayed) Labs Reviewed  BASIC METABOLIC PANEL - Abnormal; Notable for the following components:      Result Value   Glucose, Bld 111 (*)    Creatinine, Ser 1.40 (*)    GFR, Estimated 60 (*)    All other components within normal limits  CBC WITH DIFFERENTIAL/PLATELET    EKG None  Radiology No results found.  Procedures Procedures    Medications Ordered in ED Medications - No data to display  ED Course/ Medical Decision Making/ A&P                                 Medical Decision Making Amount and/or Complexity of Data Reviewed Labs: ordered.   Presents with swelling in the left groin.  Patient underwent atrial fibrillation ablation procedure 5 days ago.  He reports normal postoperative course until he became constipated and had to strain to have a bowel movement.  Since then he has noticed progressive swelling in the left groin, right groin without symptoms.  Examination does reveal what appears to be a nonpulsatile hematoma with very slight amounts of bruising overlying it.  Patient on Xarelto because of his atrial fibrillation.  Suspect normal hematoma formation secondary to this, will perform vascular ultrasound to rule out pseudoaneurysm.  Normal motor function, sensation, distal pulses on exam.  Will sign out to oncoming ER physician to follow results.        Final Clinical Impression(s) / ED Diagnoses Final diagnoses:  Hematoma of groin, initial encounter    Rx / DC Orders ED Discharge Orders     None         Aldahir Litaker, Canary Brim, MD 07/01/23 [phone]

## 2023-07-01 NOTE — Consult Note (Signed)
Hospital Consult   Reason for Consult: Left groin hematoma Requesting Physician: ED MRN #:  [id number]  History of Present Illness: This is a 52 y.o. male status post ablation last Friday for paroxysmal atrial fibrillation.  The left groin and venous access. Initially, the patient went home with no issues.  On Sunday, he felt a pop, but yesterday appreciated some swelling in the groin.  He presented to the ED today due to concern regarding the swelling.  Vascular surgery was called after ultrasound demonstrated appearance of venous pseudoaneurysm.  On exam, Lynkon was resting comfortably, his wife was bedside.  Originally from [place], he now lives in [place].  He had no complaints.  He stated the left groin swelling had improved significantly with ultrasound manipulation.  He has been compliant on Xarelto therapy, did not take this morning due to presented to the ED.    Past Medical History:  Diagnosis Date   Complication of anesthesia    "laughing gas during wisdom teeth did not take affect per patient"   Hx of adenomatous colonic polyps 08/26/2019   PAF (paroxysmal atrial fibrillation) (HCC)    CHA2DS2Vasc = 0; Patient is in a-fib occasionally.  Takes ASA only.   Shingles     Past Surgical History:  Procedure Laterality Date   ATRIAL FIBRILLATION ABLATION N/A 06/27/2023   Procedure: ATRIAL FIBRILLATION ABLATION;  Surgeon: Maurice Small, MD;  Location: [facility];  Service: Cardiovascular;  Laterality: N/A;   CORONARY CT ANGIOGRAM  03/2019   Coronary calcium score 0.  No significant CAD.  Small secundum   EXERCISE TOLERANCE TEST  02/2019   Exercised for 13:26 min; 3 mm in V1 ST elevation noted at 10 minutes of stress.->  Referred for coronary CTA, no CAD noted.  Coronary calcium score 0   FOOT SURGERY     age 67    foot wound closure     at age 69   NASAL SEPTOPLASTY W/ TURBINOPLASTY Bilateral 06/19/2020   Procedure: NASAL SEPTOPLASTY WITH TURBINATE REDUCTION;   Surgeon: Newman Pies, MD;  Location: [facility];  Service: ENT;  Laterality: Bilateral;   TRANSTHORACIC ECHOCARDIOGRAM  01/2019   Normal LV size and function.  EF 55 to 60%.  Normal valves.   WISDOM TOOTH EXTRACTION      No Known Allergies  Prior to Admission medications   Medication Sig Start Date End Date Taking? Authorizing Provider  apixaban (ELIQUIS) 5 MG TABS tablet Take 1 tablet (5 mg total) by mouth 2 (two) times daily. 06/03/23  Yes Mealor, Roberts Gaudy, MD  metoprolol tartrate (LOPRESSOR) 25 MG tablet Take 0.5 tablets (12.5 mg total) by mouth 2 (two) times daily. 12/27/22 07/01/23 Yes Mealor, Roberts Gaudy, MD  flecainide (TAMBOCOR) 50 MG tablet Take 1 tablet (50 mg total) by mouth 2 (two) times daily. Patient not taking: Reported on 07/01/2023 12/27/22   Mealor, Roberts Gaudy, MD    Social History   Socioeconomic History   Marital status: Married    Spouse name: Not on file   Number of children: 3   Years of education: Not on file   Highest education level: Not on file  Occupational History   Not on file  Tobacco Use   Smoking status: Never   Smokeless tobacco: Never  Vaping Use   Vaping status: Never Used  Substance and Sexual Activity   Alcohol use: No   Drug use: No   Sexual activity: Not on file  Other Topics Concern  Not on file  Social History Narrative   Married.   3 children.   Works as a Physicist, medical and part Chief Technology Officer.    Enjoys spending time with family.    Social Drivers of Corporate investment banker Strain: Not on file  Food Insecurity: Not on file  Transportation Needs: Not on file  Physical Activity: Not on file  Stress: Not on file  Social Connections: Not on file  Intimate Partner Violence: Not on file   Family History  Problem Relation Age of Onset   Lung cancer Mother    COPD Mother    Throat cancer Mother    Hypertension Father    Liver disease Father    Colon cancer Neg Hx    Colon polyps Neg Hx     Esophageal cancer Neg Hx    Stomach cancer Neg Hx    Rectal cancer Neg Hx     ROS: Otherwise negative unless mentioned in HPI  Physical Examination  Vitals:   07/01/23 0844 07/01/23 0945  BP:  118/72  Pulse:  (!) 55  Resp:  15  Temp: 98.3 F (36.8 C)   SpO2:  99%   Body mass index is 28.37 kg/m.  General:  WDWN in NAD Gait: Not observed HENT: WNL, normocephalic Pulmonary: normal non-labored breathing, without Rales, rhonchi,  wheezing Cardiac: regular, without  Murmurs, rubs or gallops; without carotid bruits Abdomen: soft, NT/ND, no masses Skin: without rashes Vascular Exam/Pulses: 2+ Dps Extremities: ischemic changes, without Gangrene , without cellulitis; without open wounds;  Soft, nonexpanding hematoma in the left groin.  Mild amount of ecchymosis Musculoskeletal: no muscle wasting or atrophy  Neurologic: A&O X 3;  No focal weakness or paresthesias are detected; speech is fluent/normal Psychiatric:  The pt has Normal affect. Lymph:  Unremarkable  CBC    Component Value Date/Time   WBC 9.4 07/01/2023 0458   RBC 5.29 07/01/2023 0458   HGB 15.5 07/01/2023 0458   HGB 16.3 06/03/2023 1123   HCT 45.2 07/01/2023 0458   HCT 48.0 06/03/2023 1123   PLT 209 07/01/2023 0458   PLT 219 06/03/2023 1123   MCV 85.4 07/01/2023 0458   MCV 89 06/03/2023 1123   MCH 29.3 07/01/2023 0458   MCHC 34.3 07/01/2023 0458   RDW 11.9 07/01/2023 0458   RDW 12.6 06/03/2023 1123   LYMPHSABS 2.7 07/01/2023 0458   MONOABS 0.9 07/01/2023 0458   EOSABS 0.2 07/01/2023 0458   BASOSABS 0.1 07/01/2023 0458    BMET    Component Value Date/Time   NA 139 07/01/2023 0458   NA 141 06/03/2023 1123   K 4.0 07/01/2023 0458   CL 104 07/01/2023 0458   CO2 26 07/01/2023 0458   GLUCOSE 111 (H) 07/01/2023 0458   BUN 16 07/01/2023 0458   BUN 9 06/03/2023 1123   CREATININE 1.40 (H) 07/01/2023 0458   CALCIUM 9.3 07/01/2023 0458   GFRNONAA 60 (L) 07/01/2023 0458   GFRAA >60 11/12/2015 1634     COAGS: No results found for: "INR", "PROTIME"    ASSESSMENT/PLAN: This is a 52 y.o. 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  On physical exam, the area is soft.   I had a long discussion with him regarding venous access, including pseudoaneurysm versus hematoma.  It is rare to have a venous pseudoaneurysm.  No neck appreciated, but the lesion was well-circumscribed, with a very small amount of phasic flow.  No  flow seen after 30 minute pressure hold by ultrasound. I discussed this with the technologist Shona Simpson.  I spoke to Dr. Nelly Laurence, and we both agree that the benefits outweigh the risks of holding anticoagulation over the next 3 days to ensure the venotomy remains thrombosed. The venous access should thrombose with cessation of anticoagulation.  Plan will be for repeat ultrasound in my office on Friday.  Results will be called to his office PA, and then relayed to the patient.    No plan for operative intervention at this time. He was asked to call my office should any questions or concerns arise.    Victorino Sparrow MD [facility] [phone] 07/01/2023  1:10 PM

## 2023-07-01 NOTE — ED Triage Notes (Addendum)
Patient had an ablation done earlier this week. States the left femoral area is swollen and hard. States it feels like a knot. Patient is taking  eliquis.Denies chest pain, nausea, vomiting. States Sunday night he had chills.  No fever at home.

## 2023-07-01 NOTE — Progress Notes (Addendum)
Left groin pseudoaneurysm duplex completed. Please see CV Procedures for preliminary results.  Initial findings reported to Ilene Qua, RN.  Shona Simpson, RVT 07/01/23 9:36 AM

## 2023-07-01 NOTE — Discharge Instructions (Addendum)
You will have follow-up with our vascular surgery colleagues in 4 days.  Please do not take your Eliquis until you have seen our vascular surgeon, and/or your cardiologist.  Return here for concerning changes in your condition.

## 2023-07-03 ENCOUNTER — Other Ambulatory Visit (HOSPITAL_COMMUNITY): Payer: Self-pay | Admitting: Vascular Surgery

## 2023-07-03 ENCOUNTER — Ambulatory Visit (HOSPITAL_COMMUNITY)
Admission: RE | Admit: 2023-07-03 | Discharge: 2023-07-03 | Disposition: A | Discharge status: Home / Self Care | Payer: BC Managed Care – PPO | Source: Ambulatory Visit | Attending: Vascular Surgery | Admitting: Vascular Surgery

## 2023-07-03 DIAGNOSIS — I729 Aneurysm of unspecified site: Secondary | ICD-10-CM

## 2023-07-03 DIAGNOSIS — I724 Aneurysm of artery of lower extremity: Secondary | ICD-10-CM

## 2023-07-14 NOTE — Addendum Note (Signed)
 Addendum  created 07/14/23 2128 by Marcene Duos, MD   Attestation recorded in [place], Intraprocedure Attestations filed

## 2023-07-25 ENCOUNTER — Ambulatory Visit (HOSPITAL_COMMUNITY)
Admission: RE | Admit: 2023-07-25 | Discharge: 2023-07-25 | Disposition: A | Discharge status: Home / Self Care | Payer: BC Managed Care – PPO | Source: Ambulatory Visit | Attending: Internal Medicine | Admitting: Internal Medicine

## 2023-07-25 VITALS — BP 118/76 | HR 66 | Ht 73.0 in | Wt 234.6 lb

## 2023-07-25 DIAGNOSIS — Z7901 Long term (current) use of anticoagulants: Secondary | ICD-10-CM | POA: Diagnosis not present

## 2023-07-25 DIAGNOSIS — I480 Paroxysmal atrial fibrillation: Secondary | ICD-10-CM | POA: Diagnosis present

## 2023-07-25 NOTE — Progress Notes (Addendum)
Primary Care Physician: Charlane Ferretti, DO Primary Cardiologist: Dr. Herbie Baltimore Primary Electrophysiologist: Dr. Nelly Laurence Referring Physician: Dr. Oneida Arenas Winquist is a 53 y.o. male with a history of small secundum ASD and paroxysmal atrial fibrillation who presents for consultation in the [facility]. The patient was initially diagnosed with atrial fibrillation in May 2020 with heart monitor showing symptomatic Afib with intermittent RVR. Burden reduced after decreasing caffeine intake. Review of records show patient sent message on 4/13 noting frequency in Afib episodes. He takes ASA 81 mg daily. Patient is not on anticoagulation due to having a CHADS2VASC score of zero.  On evaluation today, he is in SR. He has noted increasing episodes of Afib over the past several weeks. He has checked using his wife's watch and I can review episodes of him going in and out of Afib. He does not appear to be in RVR with his Afib episodes. He feels an uncomfortable fluttering sensation when he is in it. He does not take anticoagulation at this time due to risk score of zero. No chest pain or shortness of breath.  Wife states he snores but does not stop breathing. He feels well rested when he wakes up.  On follow up 07/25/23, patient is currently in NSR. S/p Afib ablation on 06/27/23 by Dr. Nelly Laurence. Seen in ED on 07/01/23 for concern of left groin swelling. Seen by Vascular in ED and had ultrasound in ED with decision to hold anticoagulation until repeat ultrasound. Patient had repeat ultrasound on 12/26 confirming no evidence of pseudoaneurysm. No episodes of Afib since ablation. He stopped flecainide and lopressor immediately post ablation and has felt well overall. No chest pain or SOB. Leg sites healed without issue. No missed doses of anticoagulant since restart after repeat ultrasound.   Today, he denies symptoms of orthopnea, PND, lower extremity edema, dizziness, presyncope,  syncope, snoring, daytime somnolence, bleeding, or neurologic sequela. The patient is tolerating medications without difficulties and is otherwise without complaint today.    Atrial Fibrillation Risk Factors:  he does not have symptoms or diagnosis of sleep apnea. he does not have a history of rheumatic fever. he does have a history of alcohol use. The patient does not have a history of early familial atrial fibrillation or other arrhythmias.  he has a BMI of Body mass index is 30.95 kg/m.Marland Kitchen Filed Weights   07/25/23 1133  Weight: 106.4 kg     Family History  Problem Relation Age of Onset   Lung cancer Mother    COPD Mother    Throat cancer Mother    Hypertension Father    Liver disease Father    Colon cancer Neg Hx    Colon polyps Neg Hx    Esophageal cancer Neg Hx    Stomach cancer Neg Hx    Rectal cancer Neg Hx      Atrial Fibrillation Management history:  Previous antiarrhythmic drugs: multaq, flecainide Previous cardioversions: None Previous ablations: 06/27/23 Anticoagulation history: Eliquis    Past Medical History:  Diagnosis Date   Complication of anesthesia    "laughing gas during wisdom teeth did not take affect per patient"   Hx of adenomatous colonic polyps 08/26/2019   PAF (paroxysmal atrial fibrillation) (HCC)    CHA2DS2Vasc = 0; Patient is in a-fib occasionally.  Takes ASA only.   Shingles    Past Surgical History:  Procedure Laterality Date   ATRIAL FIBRILLATION ABLATION N/A 06/27/2023   Procedure: ATRIAL FIBRILLATION ABLATION;  Surgeon: Maurice Small, MD;  Location: [facility] INVASIVE CV LAB;  Service: Cardiovascular;  Laterality: N/A;   CORONARY CT ANGIOGRAM  03/2019   Coronary calcium score 0.  No significant CAD.  Small secundum   EXERCISE TOLERANCE TEST  02/2019   Exercised for 13:26 min; 3 mm in V1 ST elevation noted at 10 minutes of stress.->  Referred for coronary CTA, no CAD noted.  Coronary calcium score 0   FOOT SURGERY     age 33     foot wound closure     at age 89   NASAL SEPTOPLASTY W/ TURBINOPLASTY Bilateral 06/19/2020   Procedure: NASAL SEPTOPLASTY WITH TURBINATE REDUCTION;  Surgeon: Newman Pies, MD;  Location: [facility];  Service: ENT;  Laterality: Bilateral;   TRANSTHORACIC ECHOCARDIOGRAM  01/2019   Normal LV size and function.  EF 55 to 60%.  Normal valves.   WISDOM TOOTH EXTRACTION      Current Outpatient Medications  Medication Sig Dispense Refill   apixaban (ELIQUIS) 5 MG TABS tablet Take 1 tablet (5 mg total) by mouth 2 (two) times daily. 60 tablet 3   ipratropium (ATROVENT) 0.06 % nasal spray SMARTSIG:2 Spray(s) Both Nares Twice Daily PRN     No current facility-administered medications for this encounter.    No Known Allergies  ROS- All systems are reviewed and negative except as per the HPI above.  Physical Exam: Vitals:   07/25/23 1133  BP: 118/76  Pulse: 66  Weight: 106.4 kg  Height: 6\' 1"  (1.854 m)    GEN- The patient is well appearing, alert and oriented x 3 today.   Neck - no JVD or carotid bruit noted Lungs- Clear to ausculation bilaterally, normal work of breathing Heart- Regular rate and rhythm, no murmurs, rubs or gallops, PMI not laterally displaced Extremities- no clubbing, cyanosis, or edema Skin - no rash or ecchymosis noted  Wt Readings from Last 3 Encounters:  07/25/23 106.4 kg  07/01/23 97.5 kg  06/27/23 97.5 kg    EKG today demonstrates  Vent. rate 66 BPM PR interval 134 ms QRS duration 92 ms QT/QTcB 394/413 ms P-R-T axes 31 95 22 Normal sinus rhythm Rightward axis Borderline ECG When compared with ECG of 27-Jun-2023 16:14, PREVIOUS ECG IS PRESENT  Echo 01/15/19 demonstrated:  1. The left ventricle has normal systolic function, with an ejection  fraction of 55-60%. The cavity size was normal. Left ventricular diastolic  parameters were normal.   2. The right ventricle has normal systolic function. The cavity was  normal.   3. The mitral valve  is grossly normal.   4. The tricuspid valve is grossly normal.   5. The aortic valve is tricuspid. No stenosis of the aortic valve.   6. Normal LV function; no significant valvular disease.  Epic records are reviewed at length today.  Exercise stress test 02/12/2019: Blood pressure demonstrated a blunted response to exercise. ST segment elevation of 3 mm was noted during stress in the V1, aVR and aVL leads, beginning at 10 minutes of stress, and returning to baseline after 1-5 minutes of recovery. No symptoms reported, but diffuse ST depression (though upsloping) with ST elevation in multiple leads. Positive adequate stress test.  Coronary CTA 03/19/19: IMPRESSION: 1. Coronary artery calcium score 0 Agatston units. This suggests low risk for future cardiac events.   2.  No significant coronary disease noted.   3.  Small secundum ASD as described above.  CHA2DS2-VASc Score = 0  The patient's score  is based upon: CHF History: 0 HTN History: 0 Diabetes History: 0 Stroke History: 0 Vascular Disease History: 0 Age Score: 0 Gender Score: 0        ASSESSMENT AND PLAN: Paroxysmal Atrial Fibrillation (ICD10:  I48.0) The patient's CHA2DS2-VASc score is 0, indicating a 0.2% annual risk of stroke.   S/p Afib ablation on 06/27/23 by Dr. Nelly Laurence.  He is in NSR.  He stopped flecainide and lopressor immediately post ablation. Continue Eliquis without interruption; advised patient will be stopped at upcoming visit.      Follow up as scheduled with EP.    Lake Bells, PA-C Afib Clinic [facility] [address] [place], [place] [address] [phone] 07/25/2023 11:57 AM

## 2023-09-24 NOTE — Progress Notes (Unsigned)
 Cardiology Office Note:  .   Date:  09/24/2023  ID:  Justin Carson, DOB [birth year], MRN [id number] PCP: Charlane Ferretti, DO  [facility] Providers EP:  Maurice Small, MD {  History of Present Illness: .   Justin Carson is a 53 y.o. male w/PMHx of  small secundum ASD and AFib, frequent PACs  Saw Dr. Nelly Laurence 06/03/23, despite flecainide increasing burden of AF, discussed as well, frequent PACs and if ongoing at the time of his ablation would try to map/ablate them as well  Ablation on 06/27/23  ER for groin concerns, 07/01/23, seen by vascular team, with [place] concerns of pseudoaneurysm. 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  No neck appreciated, but the lesion was well-circumscribed, with a very small amount of phasic flow. No flow seen after 30 minute pressure hold by ultrasound.  In d/w Dr. Nelly Laurence, and we both agree that the benefits outweigh the risks of holding anticoagulation over the next 3 days to ensure the venotomy remains thrombosed. The venous access should thrombose with cessation of anticoagulation.  Plan will be for repeat ultrasound in VVS office on Friday  07/03/23: Vascular US No evidence of pseudoaneurysm, AVF or DVT in visualized left  groin  areas.   Saw AFib clinic team 07/25/23, groins well healed at this time, no ongoing concerns No symptoms of AFib off flecainide/BB  Today's visit is scheduled as his 3 mo post ablation visit  ROS:   He feels very well No palpitations, symptoms of arrhythmia/PACs Says he was aware of the irregularity in his heart beat previously, none of that since his ablation He has a watch that give him heart rate and can alert for rhythm, and has had none, no unusual heart rates  No CP, SOB His job keeps him moving constantly, no exertional intolerances No dizzy spells, near syncope or syncope   Arrhythmia/AAD hx AFib found 2020 Multaq >  intolerant/recurrent Afib Flecainide > recurrent Afib >> stopped post ablation 06/27/23 AFib ablation (PFA) (no ablation of PACs)  Studies Reviewed: Marland Kitchen    EKG done today and reviewed by myself:  SR 66 bpm   06/27/23: EPS/ablation CONCLUSIONS: 1. Sinus rhythm upon presentation.   2. Successful ablation of all four pulmonary veins with pulsed field energy. 3. No inducible arrhythmias following ablation  4. No early apparent complications.    06/20/23: cardiac CT IMPRESSION: 1. There is normal pulmonary vein drainage into the left atrium. 2. The left atrial appendage is large chicken wing with a single lobe that folds 90 degrees, ostial size 16 x 14 mm and length 33 mm. There is no thrombus in the left atrial appendage, verified on PV delay images. 3. The esophagus runs in the left atrial midline and is not in the proximity to any of the pulmonary veins. 4. Calcium score: Coronary calcium score of 0. 5. Small secundum ASD with left to right flow noted (7 mm ostium).   TTE 01/15/2019 55-60%, normal structure and function    Zio 11/2018 - my interpretation Sinus rhythm HR 43-174, avg 65 4% AF burden -- associated with patient-triggered events; can't exclude flutter at times Rare PACs (<1%), but sometimes occurring in bigeminy and associated with palpitations   02/2019 Exercised >39m. No ischemia -- my interpretation   Risk Assessment/Calculations:    Physical Exam:   VS:  There were no vitals taken for this visit.   Wt Readings from Last 3  Encounters:  07/25/23 234 lb 9.6 oz (106.4 kg)  07/01/23 215 lb (97.5 kg)  06/27/23 215 lb (97.5 kg)    GEN: Well nourished, well developed in no acute distress NECK: No JVD; No carotid bruits CARDIAC: RRR, no murmurs, rubs, gallops RESPIRATORY:  CTA b/l without rales, wheezing or rhonchi  ABDOMEN: Soft, non-tender, non-distended EXTREMITIES: No edema; No deformity   ASSESSMENT AND PLAN: .    paroxysmal AFib CHA2DS2Vasc is  zero Discussed f/u in 3-4 mo, if no symptoms or alerts for arrhythmia via his watch, would likely come of OAC then though he wants to stop now He will let us know if any recurrent symptoms or arrhythmia alerts from his watch    ASD 5. Small secundum ASD with left to right flow noted (7 mm ostium).      Dispo: back in 3-4 mo,sooner if needed  Signed, Sheilah Pigeon, PA-C

## 2023-09-25 ENCOUNTER — Ambulatory Visit: Payer: BC Managed Care – PPO | Attending: Physician Assistant | Admitting: Physician Assistant

## 2023-09-25 VITALS — BP 108/72 | HR 66 | Ht 73.0 in | Wt 233.2 lb

## 2023-09-25 DIAGNOSIS — Q211 Atrial septal defect, unspecified: Secondary | ICD-10-CM | POA: Diagnosis not present

## 2023-09-25 DIAGNOSIS — I480 Paroxysmal atrial fibrillation: Secondary | ICD-10-CM | POA: Diagnosis not present

## 2023-09-25 NOTE — Patient Instructions (Signed)
 Medication Instructions:   STOP TAKING :  ELIQUIS   *If you need a refill on your cardiac medications before your next appointment, please call your pharmacy*    Lab Work: NONE ORDERED  TODAY    If you have labs (blood work) drawn today and your tests are completely normal, you will receive your results only by: MyChart Message (if you have MyChart) OR A paper copy in the mail If you have any lab test that is abnormal or we need to change your treatment, we will call you to review the results.   Testing/Procedures: NONE ORDERED  TODAY    Follow-Up: At [facility], you and your health needs are our priority.  As part of our continuing mission to provide you with exceptional heart care, we have created designated Provider Care Teams.  These Care Teams include your primary Cardiologist (physician) and Advanced Practice Providers (APPs -  Physician Assistants and Nurse Practitioners) who all work together to provide you with the care you need, when you need it.  We recommend signing up for the patient portal called "MyChart".  Sign up information is provided on this After Visit Summary.  MyChart is used to connect with patients for Virtual Visits (Telemedicine).  Patients are able to view lab/test results, encounter notes, upcoming appointments, etc.  Non-urgent messages can be sent to your provider as well.   To learn more about what you can do with MyChart, go to [email or web address].    Your next appointment:    4 month(s) ( CONTACT  CASSIE HALL/ ANGELINE HAMMER FOR EP SCHEDULING ISSUES )    Provider:    York Pellant, MD ONLY!!     Other Instructions

## 2024-01-07 ENCOUNTER — Telehealth: Payer: Self-pay | Admitting: Cardiovascular Disease

## 2024-01-07 DIAGNOSIS — Z0279 Encounter for issue of other medical certificate: Secondary | ICD-10-CM

## 2024-01-07 NOTE — Telephone Encounter (Signed)
 DONE - placed in completed FMLA/DOT box

## 2024-01-07 NOTE — Telephone Encounter (Signed)
 Patient brought in a [employer] physician statement form today. He signed the release of information and paid the $29 form fee. Form in Dr. Marko box.

## 2024-01-08 NOTE — Telephone Encounter (Signed)
 Patient came in today to pick up his form.  I scanned copy in chart.

## 2024-02-07 ENCOUNTER — Other Ambulatory Visit: Payer: Self-pay | Admitting: Cardiology

## 2024-02-09 NOTE — Telephone Encounter (Signed)
 Absolutely not
